# Patient Record
Sex: Female | Born: 1965 | Race: White | Hispanic: No | Marital: Married | Smoking: Never smoker
Health system: Southern US, Community
[De-identification: ages and names within clinical notes are randomized; demographics above are authoritative.]

## PROBLEM LIST (undated history)

## (undated) HISTORY — PX: TONSILLECTOMY AND ADENOIDECTOMY: SUR1326

## (undated) HISTORY — PX: REPAIR / REINSERT BICEPS TENDON AT ELBOW: SUR1148

---

## 1999-04-23 HISTORY — PX: KNEE ARTHROSCOPY W/ LATERAL RELEASE: SHX1873

## 1999-05-14 ENCOUNTER — Other Ambulatory Visit: Admission: RE | Admit: 1999-05-14 | Discharge: 1999-05-14 | Payer: Self-pay | Admitting: Gynecology

## 1999-06-26 ENCOUNTER — Other Ambulatory Visit: Admission: RE | Admit: 1999-06-26 | Discharge: 1999-06-26 | Payer: Self-pay | Admitting: Gynecology

## 1999-06-26 ENCOUNTER — Encounter (INDEPENDENT_AMBULATORY_CARE_PROVIDER_SITE_OTHER): Payer: Self-pay

## 1999-06-29 ENCOUNTER — Inpatient Hospital Stay (HOSPITAL_COMMUNITY): Admission: AD | Admit: 1999-06-29 | Discharge: 1999-06-29 | Payer: Self-pay | Admitting: Gynecology

## 2000-01-09 ENCOUNTER — Other Ambulatory Visit: Admission: RE | Admit: 2000-01-09 | Discharge: 2000-01-09 | Payer: Self-pay | Admitting: *Deleted

## 2000-07-24 ENCOUNTER — Inpatient Hospital Stay (HOSPITAL_COMMUNITY): Admission: AD | Admit: 2000-07-24 | Discharge: 2000-07-28 | Payer: Self-pay | Admitting: *Deleted

## 2000-07-24 ENCOUNTER — Encounter (INDEPENDENT_AMBULATORY_CARE_PROVIDER_SITE_OTHER): Payer: Self-pay

## 2000-07-29 ENCOUNTER — Encounter: Admission: RE | Admit: 2000-07-29 | Discharge: 2000-08-28 | Payer: Self-pay | Admitting: *Deleted

## 2000-09-08 ENCOUNTER — Other Ambulatory Visit: Admission: RE | Admit: 2000-09-08 | Discharge: 2000-09-08 | Payer: Self-pay | Admitting: *Deleted

## 2000-09-28 ENCOUNTER — Encounter: Admission: RE | Admit: 2000-09-28 | Discharge: 2000-10-28 | Payer: Self-pay | Admitting: *Deleted

## 2001-10-29 ENCOUNTER — Other Ambulatory Visit: Admission: RE | Admit: 2001-10-29 | Discharge: 2001-10-29 | Payer: Self-pay | Admitting: *Deleted

## 2002-05-26 ENCOUNTER — Inpatient Hospital Stay (HOSPITAL_COMMUNITY): Admission: AD | Admit: 2002-05-26 | Discharge: 2002-05-29 | Payer: Self-pay | Admitting: *Deleted

## 2002-07-06 ENCOUNTER — Other Ambulatory Visit: Admission: RE | Admit: 2002-07-06 | Discharge: 2002-07-06 | Payer: Self-pay | Admitting: *Deleted

## 2003-07-22 ENCOUNTER — Other Ambulatory Visit: Admission: RE | Admit: 2003-07-22 | Discharge: 2003-07-22 | Payer: Self-pay | Admitting: Gynecology

## 2003-12-07 ENCOUNTER — Encounter: Admission: RE | Admit: 2003-12-07 | Discharge: 2003-12-07 | Payer: Self-pay | Admitting: Gynecology

## 2004-09-13 ENCOUNTER — Other Ambulatory Visit: Admission: RE | Admit: 2004-09-13 | Discharge: 2004-09-13 | Payer: Self-pay | Admitting: Gynecology

## 2004-12-12 ENCOUNTER — Ambulatory Visit (HOSPITAL_COMMUNITY): Admission: RE | Admit: 2004-12-12 | Discharge: 2004-12-12 | Payer: Self-pay | Admitting: Gynecology

## 2005-11-12 ENCOUNTER — Encounter: Admission: RE | Admit: 2005-11-12 | Discharge: 2005-11-12 | Payer: Self-pay | Admitting: Gynecology

## 2006-10-08 ENCOUNTER — Other Ambulatory Visit: Admission: RE | Admit: 2006-10-08 | Discharge: 2006-10-08 | Payer: Self-pay | Admitting: Gynecology

## 2007-01-05 ENCOUNTER — Encounter: Admission: RE | Admit: 2007-01-05 | Discharge: 2007-01-05 | Payer: Self-pay | Admitting: Gynecology

## 2007-11-04 ENCOUNTER — Other Ambulatory Visit: Admission: RE | Admit: 2007-11-04 | Discharge: 2007-11-04 | Payer: Self-pay | Admitting: Gynecology

## 2008-03-24 ENCOUNTER — Ambulatory Visit (HOSPITAL_BASED_OUTPATIENT_CLINIC_OR_DEPARTMENT_OTHER): Admission: RE | Admit: 2008-03-24 | Discharge: 2008-03-24 | Payer: Self-pay | Admitting: Gynecology

## 2008-03-24 ENCOUNTER — Ambulatory Visit: Payer: Self-pay | Admitting: Radiology

## 2009-03-24 ENCOUNTER — Ambulatory Visit: Payer: Self-pay | Admitting: Women's Health

## 2009-03-24 ENCOUNTER — Other Ambulatory Visit: Admission: RE | Admit: 2009-03-24 | Discharge: 2009-03-24 | Payer: Self-pay | Admitting: Gynecology

## 2009-04-12 ENCOUNTER — Ambulatory Visit: Payer: Self-pay | Admitting: Radiology

## 2009-04-12 ENCOUNTER — Ambulatory Visit (HOSPITAL_BASED_OUTPATIENT_CLINIC_OR_DEPARTMENT_OTHER): Admission: RE | Admit: 2009-04-12 | Discharge: 2009-04-12 | Payer: Self-pay | Admitting: Gynecology

## 2010-05-13 ENCOUNTER — Encounter: Payer: Self-pay | Admitting: Gynecology

## 2010-05-14 ENCOUNTER — Encounter: Payer: Self-pay | Admitting: Gynecology

## 2010-09-07 NOTE — Discharge Summary (Signed)
   NAME:  Robin Burns, Robin Burns                 ACCOUNT NO.:  [id number]   MEDICAL RECORD NO.:  [id number]                   PATIENT TYPE:  INP   LOCATION:  9129                                 FACILITY:  [facility]   PHYSICIAN:  Ivor Costa. Farrel Gobble, M.D.              DATE OF BIRTH:  [birth year]   DATE OF ADMISSION:  05/26/2002  DATE OF DISCHARGE:  05/29/2002                                 DISCHARGE SUMMARY   DISCHARGE DIAGNOSES:  1. Intrauterine pregnancy 39 weeks delivered.  2. History of prior cesarean section, desired repeat cesarean section.  3. Supraventricular tachycardia during this pregnancy.  4. Status post repeat low transverse cesarean section by Katy Fitch,     M.D. on May 26, 2002.  Uterine dehiscence noted at time of cesarean     delivery.  5. Macrosomia.   HISTORY:  [name], para 1 with an EDC of  May 30, 2002.  Prenatal course was complicated by advanced maternal age.  Declined amniocentesis.  History of prior low transverse cesarean section.  Initially desired VBAC, but during pregnancy decided to go for repeat  cesarean section.  Had had a history of severe preeclampsia and HELLP  syndrome with prior pregnancy.  Also developed some chest palpitations,  diagnosed with SVT during the pregnancy.   HOSPITAL COURSE:  On May 26, 2002 patient was admitted at 39 weeks and  underwent a repeat low transverse cesarean section by Katy Fitch, M.D.  Underwent delivery of a female, Apgars of 8 and 8, weight of 9 pounds and 13  ounces.  There was noted, however, that there was a uterine scar was noted  to be dehisced on entrance into the pelvic cavity with otherwise normal  appearing tubes and ovaries.  Postoperatively patient remained afebrile,  voiding, stable condition and she was discharged to home on May 29, 2002  in satisfactory condition and given [facility] Gynecology postpartum  instruction/postpartum booklet.   ACCESSORY  CLINICAL FINDINGS:  Laboratories:  The patient is A+.  Rubella  immune.  On May 27, 2002 hemoglobin was 12.4.   DISPOSITION:  The patient is discharged to home.  Informed to return to  office six weeks.  If have any problem prior to that time to be seen in the  office.  Given prescription for Percocet p.r.n. pain.     Susa Loffler, P.A.                    Ivor Costa. Farrel Gobble, M.D.    TSG/MEDQ  D:  07/09/2002  T:  07/09/2002  Job:  [id number]

## 2010-09-07 NOTE — Discharge Summary (Signed)
[facility]  Patient:    Robin Burns, Robin Burns                MRN: [id number] Adm. Date:  [id number] Disc. Date: [id number] Attending:  Wetzel Bjornstad Dictator:   Antony Contras, N.P.                           Discharge Summary  DISCHARGE DIAGNOSES:          Homolysis, elevated liver enzymes, low platelet count syndrome (HELLP), at 36 6/7 weeks, oliguria, positive GBS, positive group B Streptococcus.  PROCEDURE:                    Primary low cervical transverse cesarean section with delivery of viable infant.  HISTORY OF PRESENT ILLNESS:   Patient is a 45 year old gravida 2, para 0-0-1-0 with an LMP of November 09, 1999, [facility] August 16, 2000.  Prenatal risk factors include a history of positive GBS per urine culture.  PRENATAL LABORATORIES:        Blood type A+.  Antibody screen negative.  RPR, HBSAG, HIV nonreactive.  Rubella immune.  HOSPITAL COURSE:              Patient presented to the office on July 23, 2000 with the complaint of some mild nausea and upper epigastric pain.  She denied any headaches, nausea, vomiting, visual changes.  Blood pressure was 140/90 without proteinuria.  PIH laboratories revealed her ALT to be elevated at 70, uric acid at 6, and platelets of 104,000.  She was admitted for management of severe preeclampsia and was begun on magnesium 4 g bolus, 2 g IV hourly, and also given penicillin prophylaxis for group B strep.  She was initially started on Cytotec 25 mcg vaginally and progressed to Pitocin once ruptured. Cervix at the initiation of induction was closed, thick, and high.  During the course of labor patient did develop oliguria and had a urine output of 30 cc/hour x 3 hours.  At that time it was discussed with patient and husband per Dr. Douglass Rivers to proceed to cesarean section delivery secondary to worsening disease.  Procedure was performed by Dr. Farrel Gobble assisted by Dr. Cyndia Bent under epidural anesthesia.   Patient was delivered of an Apgar 8/9 female infant weighing 6 pounds 8 ounces.  Findings also revealed normal uterus, tubes, and ovaries.  Postpartum course:  Patient was admitted to [facility] and was continued on magnesium sulfate drip.  Her laboratories began to improve after delivery.  By her second postpartum day she was able to be transferred to the postpartum unit and continue with normal postpartum care.  She was able to be discharged in satisfactory condition on her fourth postoperative day.  LABORATORIES:                 CBC:  Hematocrit 35, hemoglobin 12.6, WBC 12.7, platelets 120,000.  DISPOSITION:                  She was to follow-up in the office, continue with her prenatal vitamins and iron and Percocet for pain. DD:  09/01/00 TD:  09/01/00 Job: [id number]

## 2010-09-07 NOTE — H&P (Signed)
[facility]  Patient:    Robin Burns, Robin Burns                MRN: [id number] Adm. Date:  07/24/00 Attending:  Katy Fitch, M.D.                         History and Physical  CHIEF COMPLAINT:              A 36-week intrauterine pregnancy with severe preeclampsia.  HISTORY OF PRESENT ILLNESS:   The patient is a 45 year old, G2, P0 at 36-5/7 weeks, who presented to the office on July 23, 2000, complaining of some mild nausea with upper epigastric pain.  The patient states that she denied any headache, nausea, vomiting, or visual changes but her blood pressure was mildly elevated at 140/90 without an proteinuria.  The patient was then sent for [facility] labs which showed her ALT to be elevated at 70 and her uric acid at 6 and her platelet count to 104,000.  The patient does have good fetal movement denies any vaginal bleeding or rupture of membranes.  PAST MEDICAL HISTORY:         Significant for iron deficiency anemia.  PAST SURGICAL HISTORY:        Knee surgery in 2000.  PAST OBSTETRICAL HISTORY:     Ectopic pregnancy in March of 2001, treated with methotrexate.  PAST GYNECOLOGIC HISTORY:     Without any abnormal Paps or STDs.  MEDICATIONS:                  Prenatal vitamins.  ALLERGIES:                    None.  PERTINENT LABORATORY DATA:    A positive.  Rubella-immune.  Glucose 105.  GBS was in the urine.  PHYSICAL EXAMINATION:  VITAL SIGNS:                  Blood pressure 140/90.  HEENT:                        Throat clear.  NECK:                         Without any thyromegaly or JVD.  LUNGS:                        Clear to auscultation bilaterally.  HEART:                        Regular rate and rhythm.  ABDOMEN:                      Gravid, nontender.  Estimated fetal weight 6 pounds 10 ounces.  EXTREMITIES:                  Edema 1+.  DTRs 2+.  PELVIC:                       Cervix:  Closed, thick and high.  ASSESSMENT AND  PLAN:          A 45 year old, gravida 2, para 0 at 36 weeks with severe preeclampsia.  We will start magnesium with 4 g bolus, 2 g IV hourly.  She will also be given penicillin prophylaxis for group B streptococcus  in the urine with 5 million units IV bolus and 2.5 million units q.4h.  The patient will be started on Cytotec 25 mcg vaginally and once rupturable will be started on Pitocin. DD:  07/24/00 TD:  07/24/00 Job: [id number]

## 2010-09-07 NOTE — Op Note (Signed)
[facility]  Patient:    Robin Burns, Robin Burns                MRN: [id number] Proc. Date: 07/25/00 Adm. Date:  [id number] Attending:  Wetzel Bjornstad                           Operative Report  PREOPERATIVE DIAGNOSIS:       1. Hemolysis, elevated liver enzymes, and low                                  platelet count syndrome (HELLP) at 36-6/7                                  weeks.                               2. Oliguria.                               3. Positive group B Streptococcus.  POSTOPERATIVE DIAGNOSIS:      1. Hemolysis, elevated liver enzymes, and low                                  platelet count syndrome (HELLP) at 36-6/7                                  weeks.                               2. Oliguria.                               3. Positive group B Streptococcus.  OPERATION:                    Primary low transverse cesarean section, low flap transverse.  SURGEON:                      Douglass Rivers, M.D.  ASSISTANT:                    Jamey Reas, M.D.  ANESTHESIA:                   Epidural.  ESTIMATED BLOOD LOSS:         500 cc.  FINDINGS:                     A viable female infant in the vertex presentation, clear amniotic fluid, Apgars 8/9, birth weight 6 pounds 8 ounces, pH 7.35, normal uterus, tubes, and ovaries.  PATHOLOGY:                    Placenta.  COMPLICATIONS:                None.  DESCRIPTION OF PROCEDURE:     The patient was taken to the operating room. Epidural anesthesia  was induced, and the patient was placed in the supine position with left lateral displacement, prepped and draped in usual sterile fashion.  After adequate anesthesia was inserted, a Pfannenstiel skin incision was made with the scalpel.  Preop platelets were noted to be 90 prior to the beginning of the procedure.  The incision was carried through the underlying layer of fascia with electrocautery.  The fascia was scored in  the midline and incision was extended laterally with the Mayo scissors.  The inferior aspect of the fascial incision was grasped with Kochers.  The underlying rectus muscles were dissected off using blunt and sharp dissection. In a similar fashion, superior aspect of incision was grasped with Kochers, underlying rectus muscles dissected off.  The rectus muscle separated in the midline. The peritoneum identified and entered by blunt dissection.  The peritoneal incision was then extended superiorly to inferiorly with good visualization of underlying bowel and bladder.  Orientation of the uterus was confirmed, the vesicouterine peritoneum was identified, and entered sharply with the Metzenbaum scissors.  Incision was extended laterally.  The bladder flap was created digitally.  The bladder blade was then inserted in the lower uterine segment, incised in transverse fashion with scalpel.  Clear amniotic fluid was noted upon entering the cavity.  The infant was delivered from the vertex presentation.  Cord was cut and clamped.  The infant was handed off to waiting pediatricians.  Then pH and cord bloods were obtained.  The uterus was massaged, and the placenta was allowed to separate naturally.  The uterus was cleared of all clots and debris.  The uterine incision was repaired with a running locked layer of 0 chromic.  Inspection of the incision assured [place] of hemostasis.  The pelvis was irrigated with copious amounts of warm saline. The adnexa were inspected and noted to be normal.  The gutters were clear of all clots and debris.  Inspection of the peritoneum and underneath the bladder flap assures hemostasis.  The fascia was then closed with 0 Vicryl starting at the apex in a running fashion.  The subcutaneous tissue was irrigated.  The skin was closed with staples.  The patient tolerated the procedure well. Sponge counts were correct x 2.  She was transferred to the PACU in  stable condition. DD:  07/25/00 TD:  07/26/00 Job: [id number]

## 2010-09-07 NOTE — Discharge Summary (Signed)
[facility]  Patient:    Robin Burns, Robin Burns                MRN: [id number] Adm. Date:  [id number] Disc. Date: [id number] Attending:  Wetzel Bjornstad Dictator:   Antony Contras, N.P.                           Discharge Summary  DISCHARGE DIAGNOSES:          Intrauterine pregnancy at 36 weeks, HELLP syndrome, oliguria, failure to progress.  PROCEDURE:                    First degree low cervical transverse cesarean section with delivery of viable infant and prior to that induction of labor.  HISTORY OF PRESENT ILLNESS:   The patient is a 45 year old gravida 2, para 0-0-1-0 with an LMP of November 09, 1999, [facility] of August 16, 2000.  Prenatal risk factors include positive GBS status as noted by presence of GBS in the urine, severe preeclampsia developing at 36 weeks.  PRENATAL LABORATORIES:        Blood type A+.  Antibody screen is negative. RPR, HBSAG, HIV nonreactive.  Rubella immune.  HOSPITAL COURSE:              Patient had initially presented to the office on July 23, 2000 complaining of mild nausea with upper epigastric pain.  At this time her blood pressure was elevated 140/90 without any proteinuria.  PIH labs at that time revealed ALT to be elevated at 70, uric acid at 6, platelets 104,000.  At this time she was admitted to begin magnesium sulfate 4 g bolus, 2 g/hour.  She was also given penicillin prophylaxis for group B strep and was started on Cytotec to initiate induction of labor.  Artificial rupture of membranes was accomplished on July 25, 2000 which yielded clear fluid.  IUPC was placed.  Pitocin was initiated.  Patient remained on magnesium sulfate and Pitocin.  She slowly progressed from cervical status of closed, thick, and high to 1 cm, 90%, +1 station.  She also did develop oliguria and it was felt prudent due to the slow progress of her labor and worsening disease to proceed to cesarean section.  Low cervical transverse cesarean  section was performed by Dr. Douglass Rivers, assisted by Dr. Cyndia Bent under epidural anesthesia. Patient was delivered of an Apgar 8/9 female infant weighing 6 pounds 8 ounces. Pelvic findings included normal uterus, tubes, and ovaries.  Postpartum patient did remain on magnesium sulfate until her second postoperative day. At that time she was transferred from AICU to the postpartum unit and she was able to be discharged in satisfactory condition on her third postoperative day.  LABORATORIES:                 CBC:  Hematocrit 35, hemoglobin 12.6, WBC 12.7, platelets 120,000.  DISPOSITION:                  She is to be followed up in the office in six weeks.  Continue with prenatal vitamins.  Percocet for pain.DD:  08/11/00 TD:  08/11/00 Job: [id number]

## 2010-09-07 NOTE — Op Note (Signed)
NAME:  Robin Burns, Robin Burns                 ACCOUNT NO.:  [id number]   MEDICAL RECORD NO.:  [id number]                   PATIENT TYPE:  INP   LOCATION:  9198                                 FACILITY:  [facility]   PHYSICIAN:  Katy Fitch, M.D.               DATE OF BIRTH:  1965/11/01   DATE OF PROCEDURE:  05/26/2002  DATE OF DISCHARGE:                                 OPERATIVE REPORT   PREOPERATIVE DIAGNOSES:  1. A 39 week intrauterine pregnancy.  2. Prior low transverse cesarean section.   POSTOPERATIVE DIAGNOSES:  1. A 39 week intrauterine pregnancy.  2. Prior low transverse cesarean section.  3. Uterine dehiscence.   PROCEDURE:  Repeat low transverse cesarean section.   SURGEON:  Katy Fitch, M.D.   ASSIST:  Gaetano Hawthorne. Lily Peer, M.D.   ANESTHESIA:  Spinal.   ESTIMATED BLOOD LOSS:  850.   URINE OUTPUT:  225 and clear.   FLUIDS:  2800 crystalloid.   COMPLICATIONS:  None.   SPECIMENS:  Cord blood.   FINDINGS:  Living 9 pound 73 ounce female with Apgars 8 and 9 with three  vessel cord.  Placenta intact.  Uterine scar noted to be dehisced on  entrance into the pelvic cavity with normal appearing tubes and ovaries.   PROCEDURE:  The patient was taken to the operating room where spinal  anesthesia was administered.  The patient was prepped and draped in normal  sterile fashion.  A Pfannenstiel incision was made through the previous  incision site and carried down to the underlying fascia sharply.  The fascia  was excised and extended transversely using curved Mayo scissors.  The  underlying rectus muscles were dissected off both bluntly and sharply using  curved Mayo scissors from the fascia.  The rectus muscles were separated in  the midline manually and extended to the level of the pubic bone and  superiorly.  Entrance into the peritoneal cavity was done manually.  The  bladder was then taken down with Metzenbaum scissors.  At this point the  bladder flap was  identified and the infant's head was visible.  The bladder  flap was attempted to be taken down but once incised with Metzenbaum  scissors entrance into the uterine cavity was noted.  The incision was then  extended digitally.  The infant's head was then attempted to be delivered  with one time pushing.  Vacuum was then placed on the infant's occiput and  with one pull the infant's head was taken out of the incision without any  complications.  The mouth and nose were suctioned.  The rest of the baby was  then delivered without any difficulties.  The cord was clamped and cut and  the infant handed off to the awaiting NICU attendants.  Cord blood was  obtained and then the placenta was massaged from the uterus.  The uterus was  then exteriorized and the incision was grasped with ring forceps.  The  bladder was taken off the lower uterine segment.  A running 1-0 chromic  interlocking stitch was then used to close the lower uterine segment.  A  second layer was used to close the imbricating fascia.  The incision  appeared to be hemostatic at this point.  Pelvic irrigation was then done  with warm normal saline.  The uterus was returned to the abdominal cavity  and the subfascial area was inspected and noted to be hemostatic.  The  fascia was then started to be reapproximated with 0 Vicryl.  At this point  some bleeding was noted.  The closure of the fascia was discontinued.  The  suture was removed and reinspection of the pelvis was then commenced in a  methodical manner whereby two areas of the incision were closed with a  figure-of-eight.  The peritoneum which was scarred to the uterus was also  cauterized using a Bovie due to having some weeping and Surgicel was then  placed along the uterine incision and along the peritoneal edges.  The  pelvis then appeared to be hemostatic once again.  Reapproximation of the  fascia was once again attempted with 0 Vicryl in a running continuous  stitch.   At this point no blood was noted to be welling up.  The fascia was  completely closed at each angle and meeting in the midline.  The  subcutaneous tissue was then irrigated and Bovie cauterized in several areas  and the skin reapproximated using staples.  The patient was taken to  recovery room in stable condition.                                               Katy Fitch, M.D.    DC/MEDQ  D:  05/26/2002  T:  05/26/2002  Job:  [id number]

## 2010-09-07 NOTE — H&P (Signed)
   NAME:  Robin Burns, Robin Burns                 ACCOUNT NO.:  [id number]   MEDICAL RECORD NO.:  [id number]                   PATIENT TYPE:  INP   LOCATION:  NA                                   FACILITY:  [facility]   PHYSICIAN:  Katy Fitch, M.D.               DATE OF BIRTH:  Oct 14, 1965   DATE OF ADMISSION:  05/25/2002  DATE OF DISCHARGE:                                HISTORY & PHYSICAL   CHIEF COMPLAINT:  Elective repeat cesarean section.   HISTORY OF PRESENT ILLNESS:  The patient is a 45 year old G2, P1, at [redacted]  weeks gestation who will be admitted for elective repeat cesarean section.  The patient was initially wanting to attempt VBAC; however, she has changed  her mind and is opting to do elective repeat C section.  The patient was  advanced maternal age and refused amniocentesis during her pregnancy.  She  did develop some chest palpitations and was diagnosed with SVT during the  pregnancy but is not currently on any medications.   PAST MEDICAL HISTORY:  None.   PAST SURGICAL HISTORY:  1. Prior cesarean section for preeclampsia in 2002.  2. Knee surgery in 2000.  3. D&E.   MEDICATIONS:  Prenatal vitamins.   ALLERGIES:  No known drug allergies.   SOCIAL HISTORY:  Denies tobacco, alcohol, or drugs.   FAMILY HISTORY:  Denies mental retardation or epithelial cancers.   PHYSICAL EXAMINATION:  VITAL SIGNS:  Blood pressure 120/70.  HEENT:  Throat clear.  LUNGS:  Clear to auscultation bilaterally.  HEART:  Regular rate and rhythm.  ABDOMEN:  Gravid, nontender.  Estimated fetal weight 9 pounds.  PELVIC:  Cervix closed, thick, and high.   ASSESSMENT/PLAN:  A 45 year old gravida 2, para 1, admitted at 39 weeks for  elective repeat cesarean section.  The patient was given risks of surgery  including bleeding, transfusion, hepatitis or Human immunodeficiency virus,  infection, scar tissue, bleeding, breakdown, damage to internal organs,  fistula formation, need for hysterectomy  and future infertility, anesthetic  complications, pulmonary embolus, heart attack, stroke, or death.  The  patient states she understands these risks and desires to proceed.                                               Katy Fitch, M.D.   DC/MEDQ  D:  05/25/2002  T:  05/25/2002  Job:  [id number]

## 2010-09-11 ENCOUNTER — Other Ambulatory Visit: Payer: Self-pay | Admitting: Gynecology

## 2010-09-11 DIAGNOSIS — Z1231 Encounter for screening mammogram for malignant neoplasm of breast: Secondary | ICD-10-CM

## 2010-09-13 ENCOUNTER — Ambulatory Visit
Admission: RE | Admit: 2010-09-13 | Discharge: 2010-09-13 | Disposition: A | Discharge status: Home / Self Care | Payer: BC Managed Care – PPO | Source: Ambulatory Visit | Attending: Gynecology | Admitting: Gynecology

## 2010-09-13 DIAGNOSIS — Z1231 Encounter for screening mammogram for malignant neoplasm of breast: Secondary | ICD-10-CM

## 2010-12-05 ENCOUNTER — Encounter: Payer: Self-pay | Admitting: *Deleted

## 2010-12-12 ENCOUNTER — Encounter: Payer: Self-pay | Admitting: Women's Health

## 2010-12-12 ENCOUNTER — Other Ambulatory Visit (HOSPITAL_COMMUNITY)
Admission: RE | Admit: 2010-12-12 | Discharge: 2010-12-12 | Disposition: A | Discharge status: Home / Self Care | Payer: BC Managed Care – PPO | Source: Ambulatory Visit | Attending: Women's Health | Admitting: Women's Health

## 2010-12-12 ENCOUNTER — Ambulatory Visit (INDEPENDENT_AMBULATORY_CARE_PROVIDER_SITE_OTHER): Payer: BC Managed Care – PPO | Admitting: Women's Health

## 2010-12-12 VITALS — BP 120/70 | Ht 63.0 in | Wt 140.0 lb

## 2010-12-12 DIAGNOSIS — N920 Excessive and frequent menstruation with regular cycle: Secondary | ICD-10-CM

## 2010-12-12 DIAGNOSIS — Z01419 Encounter for gynecological examination (general) (routine) without abnormal findings: Secondary | ICD-10-CM

## 2010-12-12 MED ORDER — IBUPROFEN 600 MG PO TABS: 600.0000 mg | ORAL_TABLET | Freq: Three times a day (TID) | ORAL | Status: AC | PRN

## 2010-12-12 NOTE — Progress Notes (Signed)
Robin Burns [facility] 1965-06-05 [id number]    History:    The patient presents for annual exam.  Stay-at-home mom, very athletic, having some problems with a tennis elbow. She does have followup with her orthopedist.   Past medical history, past surgical history, family history and social history were all reviewed and documented in the EPIC chart.   ROS:  A  ROS was performed and pertinent positives and negatives are included in the history.  Exam:  Filed Vitals:   12/12/10 0840  BP: 120/70    General appearance:  Normal Head/Neck:  Normal, without cervical or supraclavicular adenopathy. Thyroid:  Symmetrical, normal in size, without palpable masses or nodularity. Respiratory  Effort:  Normal  Auscultation:  Clear without wheezing or rhonchi Cardiovascular  Auscultation:  Regular rate, without rubs, murmurs or gallops  Edema/varicosities:  Not grossly evident Abdominal   Soft,nontender, without masses, guarding or rebound.  Liver/spleen:  No organomegaly noted  Hernia:  None appreciated  Occult test:   Skin  Inspection:  Grossly normal  Palpation:  Grossly normal Neurologic/psychiatric  Orientation:  Normal with appropriate conversation.  Mood/affect:  Normal  Genitourinary    Breasts: Examined lying and sitting.     Right: Without masses, retractions, discharge or axillary adenopathy.     Left: Without masses, retractions, discharge or axillary adenopathy.   Inguinal/mons:  Normal without inguinal adenopathy  External genitalia:  Normal  BUS/Urethra/Skene's glands:  Normal  Bladder:  Normal  Vagina:  Normal  Cervix:  Normal  Uterus:  retroverted, normal in size, shape and contour.  Midline and mobile  Adnexa/parametria:     Rt: Without masses or tenderness.   Lt: Without masses or tenderness.  Anus and perineum: Normal  Digital rectal exam: Normal sphincter tone without palpated masses or tenderness  Assessment/Plan:  45 y.o.  MWF G3P2 for annual exam.    Having a regular monthly cycle for 5-6 days 2 or 3 of those days are heavy. Husband had a vasectomy. Will try Motrin 600 every 8 hours with her cycles to see if that may lighten her flow. Reviewed lysed the the, Mirena IUD, pills, will try the Motrin first. SBEs, normal mammogram in May, will continue an annual screening. Calcium rich diet encouraged, she does have an yearly scheduled with a primary care will have her labs done at her Pap only today. History of all normal Paps.Marland Kitchen    Harrington Challenger WHNP, 9:05 AM 12/12/2010

## 2010-12-27 ENCOUNTER — Ambulatory Visit (HOSPITAL_BASED_OUTPATIENT_CLINIC_OR_DEPARTMENT_OTHER)
Admission: RE | Admit: 2010-12-27 | Discharge: 2010-12-27 | Disposition: A | Discharge status: Home / Self Care | Payer: BC Managed Care – PPO | Source: Ambulatory Visit | Attending: Orthopedic Surgery | Admitting: Orthopedic Surgery

## 2010-12-27 DIAGNOSIS — M7710 Lateral epicondylitis, unspecified elbow: Secondary | ICD-10-CM | POA: Insufficient documentation

## 2010-12-27 DIAGNOSIS — M249 Joint derangement, unspecified: Secondary | ICD-10-CM | POA: Insufficient documentation

## 2010-12-27 LAB — POCT HEMOGLOBIN-HEMACUE: Hemoglobin: 13.5 g/dL (ref 12.0–15.0)

## 2011-01-01 NOTE — Op Note (Addendum)
  NAMEJHOANNA, Robin Burns       ACCOUNT NO.:  [id number]  MEDICAL RECORD NO.:  [id number]  LOCATION:                                 FACILITY:  PHYSICIAN:  Loreta Ave, M.D. DATE OF BIRTH:  21-Jun-1965  DATE OF PROCEDURE:  12/27/2010 DATE OF DISCHARGE:                              OPERATIVE REPORT   PREOPERATIVE DIAGNOSIS:  Overuse, lateral epicondylitis, right elbow with tearing extensor carpi radialis brevis tendon.  POSTOPERATIVE DIAGNOSIS:  Overuse, lateral epicondylitis, right elbow with tearing extensor carpi radialis brevis tendon.  PROCEDURE:  Right elbow exploration with debridement of ECRB tendon. Epicondylar debriding and drilling.  Repair of superficial extensors over defect.  SURGEON:  Loreta Ave, MD  ASSISTANT:  Genene Churn. Barry Dienes, [place]  ANESTHESIA:  General.  BLOOD LOSS:  Minimal.  SPECIMENS:  None.  CULTURES:  None.  COMPLICATIONS:  None.  DRESSING:  Sterile compressive with splint.DESCRIPTION OF PROCEDURE:  The patient was brought to the operating room and placed on the operating table in supine position.  After adequate anesthesia had been obtained, tourniquet applied, prepped and draped in usual sterile fashion.  Exsanguinated elevation of Esmarch, tourniquet inflated to 250 mmHg.  At completion of the case, final tourniquet time was 45 minutes.  Longitudinal incision from the epicondyle distally. Skin and subcutaneous tissue divided.  Superficial extensors intact, divided longitudinally.  Complete tearing ECRB tendon as well as capsule with marked mucinous degeneration extending down to the level of the radial head.  All torn abnormal inflated tissue debrided back to healthy tissue.  Superficial extensors still intact.  Moderate effusion including the elbow.  No evidence of instability and no significant chondral change in the elbow.  Irrigated.  Epicondyle debrided, treated with multiple drilling.  Irrigated once again.  Superficial  extensors were then repaired with a running Vicryl back over top of the defect, maintaining longitudinal continuity.  Wound irrigated and closed with subcutaneous subcuticular Vicryl.  Sterile compressive dressing applied.  Tourniquet deflated and removed.  Splint applied.  Anesthesia reversed.  Brought to recovery room.  Tolerated the surgery well, no complications.     Loreta Ave, M.D.     DFM/MEDQ  D:  12/27/2010  T:  12/27/2010  Job:  [id number]  Electronically Signed by Mckinley Jewel M.D. on 01/23/2011 02:31:30 PM

## 2011-09-02 ENCOUNTER — Other Ambulatory Visit: Payer: Self-pay | Admitting: Gynecology

## 2011-09-02 DIAGNOSIS — Z1231 Encounter for screening mammogram for malignant neoplasm of breast: Secondary | ICD-10-CM

## 2011-09-17 ENCOUNTER — Ambulatory Visit
Admission: RE | Admit: 2011-09-17 | Discharge: 2011-09-17 | Disposition: A | Discharge status: Home / Self Care | Payer: Commercial Indemnity | Source: Ambulatory Visit | Attending: Gynecology | Admitting: Gynecology

## 2011-09-17 DIAGNOSIS — Z1231 Encounter for screening mammogram for malignant neoplasm of breast: Secondary | ICD-10-CM

## 2012-03-26 ENCOUNTER — Ambulatory Visit (INDEPENDENT_AMBULATORY_CARE_PROVIDER_SITE_OTHER): Payer: PRIVATE HEALTH INSURANCE | Admitting: Women's Health

## 2012-03-26 ENCOUNTER — Encounter: Payer: Self-pay | Admitting: Women's Health

## 2012-03-26 VITALS — BP 102/72 | Ht 63.5 in | Wt 142.0 lb

## 2012-03-26 DIAGNOSIS — Z01419 Encounter for gynecological examination (general) (routine) without abnormal findings: Secondary | ICD-10-CM

## 2012-03-26 DIAGNOSIS — G44209 Tension-type headache, unspecified, not intractable: Secondary | ICD-10-CM

## 2012-03-26 DIAGNOSIS — F4541 Pain disorder exclusively related to psychological factors: Secondary | ICD-10-CM

## 2012-03-26 NOTE — Progress Notes (Signed)
Robin Burns [facility] Jan 06, 1966 [id number]    History:    The patient presents for annual exam.  Regular monthly cycle/vasectomy. Cycles heavy for 2 days good relief with Motrin. Labs at primary care. Normal mammogram and Pap history. Right elbow surgery last year/tennis injury. Currently being seen by Dr. Clarisse Gouge for headaches.   Past medical history, past surgical history, family history and social history were all reviewed and documented in the EPIC chart. 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  HELLP syndrome with first pregnancy. Hockey player and coaches.   ROS:  A  ROS was performed and pertinent positives and negatives are included in the history.  Exam:  Filed Vitals:   03/26/12 1120  BP: 102/72    General appearance:  Normal Head/Neck:  Normal, without cervical or supraclavicular adenopathy. Thyroid:  Symmetrical, normal in size, without palpable masses or nodularity. Respiratory  Effort:  Normal  Auscultation:  Clear without wheezing or rhonchi Cardiovascular  Auscultation:  Regular rate, without rubs, murmurs or gallops  Edema/varicosities:  Not grossly evident Abdominal  Soft,nontender, without masses, guarding or rebound.  Liver/spleen:  No organomegaly noted  Hernia:  None appreciated  Skin  Inspection:  Grossly normal  Palpation:  Grossly normal Neurologic/psychiatric  Orientation:  Normal with appropriate conversation.  Mood/affect:  Normal  Genitourinary    Breasts: Examined lying and sitting.     Right: Without masses, retractions, discharge or axillary adenopathy.     Left: Without masses, retractions, discharge or axillary adenopathy.   Inguinal/mons:  Normal without inguinal adenopathy  External genitalia:  Normal  BUS/Urethra/Skene's glands:  Normal  Bladder:  Normal  Vagina:  Normal  Cervix:  Normal  Uterus:   normal in size, shape and contour.  Midline and mobile  Adnexa/parametria:     Rt: Without masses or tenderness.   Lt: Without masses  or tenderness.  Anus and perineum: Normal  Digital rectal exam: Normal sphincter tone without palpated masses or tenderness  Assessment/Plan:  46 y.o. M. WF G3 P2 for annual exam.     Headaches-Dr. Lewit Normal GYN exam/vasectomy  Plan: SBE's, continue annual mammogram, calcium rich diet, vitamin D 1000 daily encouraged. Labs at primary care, UA, Pap normal 2012, new screening guidelines reviewed. Continue Motrin as needed for menorrhagia.  Harrington Challenger [facility], 12:04 PM 03/26/2012

## 2012-03-26 NOTE — Patient Instructions (Addendum)

## 2012-03-27 LAB — URINALYSIS W MICROSCOPIC + REFLEX CULTURE
Crystals: NONE SEEN
Hgb urine dipstick: NEGATIVE
Ketones, ur: NEGATIVE mg/dL
Leukocytes, UA: NEGATIVE
Squamous Epithelial / LPF: NONE SEEN

## 2013-02-24 ENCOUNTER — Ambulatory Visit (HOSPITAL_BASED_OUTPATIENT_CLINIC_OR_DEPARTMENT_OTHER)
Admission: RE | Admit: 2013-02-24 | Discharge: 2013-02-24 | Disposition: A | Discharge status: Home / Self Care | Payer: PRIVATE HEALTH INSURANCE | Source: Ambulatory Visit | Attending: Women's Health | Admitting: Women's Health

## 2013-02-24 ENCOUNTER — Other Ambulatory Visit: Payer: Self-pay

## 2013-02-24 DIAGNOSIS — Z1231 Encounter for screening mammogram for malignant neoplasm of breast: Secondary | ICD-10-CM

## 2013-03-30 ENCOUNTER — Other Ambulatory Visit (HOSPITAL_COMMUNITY)
Admission: RE | Admit: 2013-03-30 | Discharge: 2013-03-30 | Disposition: A | Discharge status: Home / Self Care | Payer: PRIVATE HEALTH INSURANCE | Source: Ambulatory Visit | Attending: Gynecology | Admitting: Gynecology

## 2013-03-30 ENCOUNTER — Ambulatory Visit (INDEPENDENT_AMBULATORY_CARE_PROVIDER_SITE_OTHER): Payer: 59 | Admitting: Women's Health

## 2013-03-30 ENCOUNTER — Encounter: Payer: Self-pay | Admitting: Women's Health

## 2013-03-30 VITALS — BP 116/74 | Ht 63.5 in | Wt 152.6 lb

## 2013-03-30 DIAGNOSIS — Z13220 Encounter for screening for lipoid disorders: Secondary | ICD-10-CM

## 2013-03-30 DIAGNOSIS — Z833 Family history of diabetes mellitus: Secondary | ICD-10-CM

## 2013-03-30 DIAGNOSIS — E079 Disorder of thyroid, unspecified: Secondary | ICD-10-CM

## 2013-03-30 DIAGNOSIS — Z01419 Encounter for gynecological examination (general) (routine) without abnormal findings: Secondary | ICD-10-CM | POA: Insufficient documentation

## 2013-03-30 DIAGNOSIS — N946 Dysmenorrhea, unspecified: Secondary | ICD-10-CM

## 2013-03-30 LAB — CBC WITH DIFFERENTIAL/PLATELET
Basophils Relative: 1 % (ref 0–1)
Eosinophils Relative: 2 % (ref 0–5)
HCT: 39.5 % (ref 36.0–46.0)
Hemoglobin: 13.3 g/dL (ref 12.0–15.0)
Lymphocytes Relative: 28 % (ref 12–46)
Lymphs Abs: 1.4 10*3/uL (ref 0.7–4.0)
MCV: 84.6 fL (ref 78.0–100.0)
Monocytes Absolute: 0.3 10*3/uL (ref 0.1–1.0)
Neutro Abs: 3 10*3/uL (ref 1.7–7.7)
RDW: 14 % (ref 11.5–15.5)
WBC: 4.9 10*3/uL (ref 4.0–10.5)

## 2013-03-30 LAB — URINALYSIS W MICROSCOPIC + REFLEX CULTURE
Bacteria, UA: NONE SEEN
Casts: NONE SEEN
Crystals: NONE SEEN
Ketones, ur: NEGATIVE mg/dL
Leukocytes, UA: NEGATIVE
Nitrite: NEGATIVE
Specific Gravity, Urine: 1.006 (ref 1.005–1.030)
Squamous Epithelial / LPF: NONE SEEN

## 2013-03-30 LAB — LIPID PANEL: Total CHOL/HDL Ratio: 3.1 Ratio

## 2013-03-30 MED ORDER — ETONOGESTREL-ETHINYL ESTRADIOL 0.12-0.015 MG/24HR VA RING: VAGINAL_RING | VAGINAL | Status: DC

## 2013-03-30 MED ORDER — IBUPROFEN 800 MG PO TABS: 800.0000 mg | ORAL_TABLET | Freq: Three times a day (TID) | ORAL | Status: DC | PRN

## 2013-03-30 NOTE — Progress Notes (Signed)
Robin Burns [facility] 07-01-1965 [id number]    History:    The patient presents for annual exam.  Monthly cycle with increasing dysmenorrhea/menorrhagia/moodiness. No bleeding between cycles. Vasectomy. Normal Pap and mammogram history. Headaches better, has seen Dr. Vela Prose   Past medical history, past surgical history, family history and social history were all reviewed and documented in the EPIC chart. 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   ROS:  A  ROS was performed and pertinent positives and negatives are included in the history.  Exam:  Filed Vitals:   03/30/13 0834  BP: 116/74    General appearance:  Normal Head/Neck:  Normal, without cervical or supraclavicular adenopathy. Thyroid:  Symmetrical, normal in size, without palpable masses or nodularity. Respiratory  Effort:  Normal  Auscultation:  Clear without wheezing or rhonchi Cardiovascular  Auscultation:  Regular rate, without rubs, murmurs or gallops  Edema/varicosities:  Not grossly evident Abdominal  Soft,nontender, without masses, guarding or rebound.  Liver/spleen:  No organomegaly noted  Hernia:  None appreciated  Skin  Inspection:  Grossly normal  Palpation:  Grossly normal Neurologic/psychiatric  Orientation:  Normal with appropriate conversation.  Mood/affect:  Normal  Genitourinary    Breasts: Examined lying and sitting.     Right: Without masses, retractions, discharge or axillary adenopathy.     Left: Without masses, retractions, discharge or axillary adenopathy.   Inguinal/mons:  Normal without inguinal adenopathy  External genitalia:  Normal  BUS/Urethra/Skene's glands:  Normal  Bladder:  Normal  Vagina:  Normal  Cervix:  Normal  Uterus:   normal in size, shape and contour.  Midline and mobile  Adnexa/parametria:     Rt: Without masses or tenderness.   Lt: Without masses or tenderness.  Anus and perineum: Normal  Digital  rectal exam: Normal sphincter tone without palpated masses or tenderness  Assessment/Plan:  47 y.o.MWF G2P2  for annual exam.     Perimenopausal Vasectomy  Plan: Options reviewed, would like to try NuvaRing prescription, proper use, slight risk for blood clots, strokes reviewed. Will start with next cycle. Instructed to call if no relief of dysmenorrhea/menorrhagia/moodiness. SBE's, continue annual mammogram, a3D tomography reviewed and encouraged, history of dense breasts. Continue regular exercise, calcium rich diet, vitamin D 1000 daily encouraged. CBC, glucose, lipid panel, TSH, UA, Pap.  Normal 2012, new screening guidelines reviewed.   Harrington Challenger [facility], 9:17 AM 03/30/2013

## 2013-03-30 NOTE — Addendum Note (Signed)
Addended by: Richardson Chiquito on: 03/30/2013 10:31 AM   Modules accepted: Orders

## 2013-03-30 NOTE — Patient Instructions (Signed)

## 2013-11-16 ENCOUNTER — Other Ambulatory Visit: Payer: Self-pay | Admitting: Orthopedic Surgery

## 2013-11-16 DIAGNOSIS — M25512 Pain in left shoulder: Secondary | ICD-10-CM

## 2013-11-22 ENCOUNTER — Ambulatory Visit
Admission: RE | Admit: 2013-11-22 | Discharge: 2013-11-22 | Disposition: A | Discharge status: Home / Self Care | Payer: 59 | Source: Ambulatory Visit | Attending: Orthopedic Surgery | Admitting: Orthopedic Surgery

## 2013-11-22 DIAGNOSIS — M25512 Pain in left shoulder: Secondary | ICD-10-CM

## 2013-11-24 DIAGNOSIS — M25812 Other specified joint disorders, left shoulder: Secondary | ICD-10-CM | POA: Insufficient documentation

## 2014-02-21 ENCOUNTER — Encounter: Payer: Self-pay | Admitting: Women's Health

## 2014-04-04 ENCOUNTER — Other Ambulatory Visit: Payer: Self-pay | Admitting: Gynecology

## 2014-04-04 DIAGNOSIS — Z1231 Encounter for screening mammogram for malignant neoplasm of breast: Secondary | ICD-10-CM

## 2014-04-08 ENCOUNTER — Ambulatory Visit (HOSPITAL_BASED_OUTPATIENT_CLINIC_OR_DEPARTMENT_OTHER)
Admission: RE | Admit: 2014-04-08 | Discharge: 2014-04-08 | Disposition: A | Discharge status: Home / Self Care | Payer: PRIVATE HEALTH INSURANCE | Source: Ambulatory Visit | Attending: Gynecology | Admitting: Gynecology

## 2014-04-08 DIAGNOSIS — Z1231 Encounter for screening mammogram for malignant neoplasm of breast: Secondary | ICD-10-CM | POA: Insufficient documentation

## 2014-04-29 ENCOUNTER — Encounter: Payer: 59 | Admitting: Women's Health

## 2014-05-11 ENCOUNTER — Encounter: Payer: 59 | Admitting: Women's Health

## 2015-03-23 ENCOUNTER — Encounter: Payer: Self-pay | Admitting: Women's Health

## 2015-03-23 ENCOUNTER — Other Ambulatory Visit (HOSPITAL_COMMUNITY)
Admission: RE | Admit: 2015-03-23 | Discharge: 2015-03-23 | Disposition: A | Discharge status: Home / Self Care | Payer: PRIVATE HEALTH INSURANCE | Source: Ambulatory Visit | Attending: Women's Health | Admitting: Women's Health

## 2015-03-23 ENCOUNTER — Ambulatory Visit (INDEPENDENT_AMBULATORY_CARE_PROVIDER_SITE_OTHER): Payer: 59 | Admitting: Women's Health

## 2015-03-23 VITALS — BP 115/73 | Ht 63.0 in | Wt 152.2 lb

## 2015-03-23 DIAGNOSIS — Z1329 Encounter for screening for other suspected endocrine disorder: Secondary | ICD-10-CM

## 2015-03-23 DIAGNOSIS — Z01419 Encounter for gynecological examination (general) (routine) without abnormal findings: Secondary | ICD-10-CM | POA: Insufficient documentation

## 2015-03-23 DIAGNOSIS — Z1151 Encounter for screening for human papillomavirus (HPV): Secondary | ICD-10-CM | POA: Insufficient documentation

## 2015-03-23 DIAGNOSIS — Z13220 Encounter for screening for lipoid disorders: Secondary | ICD-10-CM | POA: Diagnosis not present

## 2015-03-23 DIAGNOSIS — N946 Dysmenorrhea, unspecified: Secondary | ICD-10-CM

## 2015-03-23 DIAGNOSIS — N920 Excessive and frequent menstruation with regular cycle: Secondary | ICD-10-CM | POA: Insufficient documentation

## 2015-03-23 LAB — CBC WITH DIFFERENTIAL/PLATELET
Basophils Absolute: 0.1 10*3/uL (ref 0.0–0.1)
Basophils Relative: 1 % (ref 0–1)
EOS ABS: 0.1 10*3/uL (ref 0.0–0.7)
EOS PCT: 1 % (ref 0–5)
HCT: 39.6 % (ref 36.0–46.0)
Hemoglobin: 13.1 g/dL (ref 12.0–15.0)
LYMPHS ABS: 1.3 10*3/uL (ref 0.7–4.0)
Lymphocytes Relative: 26 % (ref 12–46)
MCH: 28.5 pg (ref 26.0–34.0)
MCHC: 33.1 g/dL (ref 30.0–36.0)
MCV: 86.3 fL (ref 78.0–100.0)
MONO ABS: 0.3 10*3/uL (ref 0.1–1.0)
MONOS PCT: 6 % (ref 3–12)
MPV: 9.5 fL (ref 8.6–12.4)
Neutro Abs: 3.3 10*3/uL (ref 1.7–7.7)
Neutrophils Relative %: 66 % (ref 43–77)
PLATELETS: 260 10*3/uL (ref 150–400)
RBC: 4.59 MIL/uL (ref 3.87–5.11)
RDW: 13.3 % (ref 11.5–15.5)
WBC: 5 10*3/uL (ref 4.0–10.5)

## 2015-03-23 LAB — COMPREHENSIVE METABOLIC PANEL
ALT: 14 U/L (ref 6–29)
AST: 17 U/L (ref 10–35)
Albumin: 3.8 g/dL (ref 3.6–5.1)
Alkaline Phosphatase: 31 U/L — ABNORMAL LOW (ref 33–115)
BILIRUBIN TOTAL: 0.3 mg/dL (ref 0.2–1.2)
BUN: 10 mg/dL (ref 7–25)
CALCIUM: 9.1 mg/dL (ref 8.6–10.2)
CO2: 23 mmol/L (ref 20–31)
Chloride: 104 mmol/L (ref 98–110)
Creat: 0.79 mg/dL (ref 0.50–1.10)
GLUCOSE: 93 mg/dL (ref 65–99)
Potassium: 4.2 mmol/L (ref 3.5–5.3)
Sodium: 138 mmol/L (ref 135–146)
Total Protein: 6.4 g/dL (ref 6.1–8.1)

## 2015-03-23 LAB — LIPID PANEL
CHOL/HDL RATIO: 3.2 ratio (ref <=5.0)
Cholesterol: 186 mg/dL (ref 125–200)
HDL: 58 mg/dL (ref >=46)
LDL CALC: 115 mg/dL (ref <130)
Triglycerides: 65 mg/dL (ref <150)
VLDL: 13 mg/dL (ref <30)

## 2015-03-23 LAB — TSH: TSH: 2.267 u[IU]/mL (ref 0.350–4.500)

## 2015-03-23 MED ORDER — ETONOGESTREL-ETHINYL ESTRADIOL 0.12-0.015 MG/24HR VA RING
VAGINAL_RING | VAGINAL | Status: DC
Start: 2015-03-23 — End: 2016-03-06

## 2015-03-23 NOTE — Progress Notes (Signed)
Robin Burns [birth year] [id number]    History:    Presents for annual exam.  Cycles every 3 weeks heavy flow changing every 1-2 hours passing clots. No bleeding or spotting between cycles. Reports cycles have become heavier over the last 2 years . Vasectomy. Normal Pap and mammogram history. Taking an over-the-counter mood supplement which she reports is helping.  Past medical history, past surgical history, family history and social history were all reviewed and documented in the EPIC chart. 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  ROS:  A ROS was performed and pertinent positives and negatives are included.  Exam:  Filed Vitals:   03/23/15 0835  BP: 115/73    General appearance:  Normal Thyroid:  Symmetrical, normal in size, without palpable masses or nodularity. Respiratory  Auscultation:  Clear without wheezing or rhonchi Cardiovascular  Auscultation:  Regular rate, without rubs, murmurs or gallops  Edema/varicosities:  Not grossly evident Abdominal  Soft,nontender, without masses, guarding or rebound.  Liver/spleen:  No organomegaly noted  Hernia:  None appreciated  Skin  Inspection:  Grossly normal   Breasts: Examined lying and sitting.     Right: Without masses, retractions, discharge or axillary adenopathy.     Left: Without masses, retractions, discharge or axillary adenopathy. Gentitourinary   Inguinal/mons:  Normal without inguinal adenopathy  External genitalia:  Normal  BUS/Urethra/Skene's glands:  Normal  Vagina:  Normal  Cervix:  Normal  Uterus:  normal in size, shape and contour.  Midline and mobile  Adnexa/parametria:     Rt: Without masses or tenderness.   Lt: Without masses or tenderness.  Anus and perineum: Normal  Digital rectal exam: Normal sphincter tone without palpated masses or tenderness  Assessment/Plan:  49 y.o. MWF G2 P2  for annual exam with complaint of heavy cycles every 3  weeks, requesting help with cycle control and flow.  Monthly cycle with Menorrhagia/vasectomy  Plan: Options reviewed, will try NuvaRing prescription, proper use given and reviewed slight risk for blood clots and strokes. Instructed to call if no relief of menorrhagia after 2 or 3 cycles. SBE's, continue annual screening mammogram, 3-D tomography reviewed and encouraged history of dense breasts. Continue active lifestyle, exercise, tennis, calcium rich diet. CBC, TSH, lipid panel, CMP, UA, Pap with HR HPV typing, new screening guidelines reviewed.    Harrington ChallengerYOUNG,Raahil Ong [facility] LLCWHNP, 10:27 AM 03/23/2015

## 2015-03-23 NOTE — Patient Instructions (Signed)
Health Maintenance, Female Adopting a healthy lifestyle and getting preventive care can go a long way to promote health and wellness. Talk with your health care provider about what schedule of regular examinations is right for you. This is a good chance for you to check in with your provider about disease prevention and staying healthy. In between checkups, there are plenty of things you can do on your own. Experts have done a lot of research about which lifestyle changes and preventive measures are most likely to keep you healthy. Ask your health care provider for more information. WEIGHT AND DIET  Eat a healthy diet  Be sure to include plenty of vegetables, fruits, low-fat dairy products, and lean protein.  Do not eat a lot of foods high in solid fats, added sugars, or salt.  Get regular exercise. This is one of the most important things you can do for your health.  Most adults should exercise for at least 150 minutes each week. The exercise should increase your heart rate and make you sweat (moderate-intensity exercise).  Most adults should also do strengthening exercises at least twice a week. This is in addition to the moderate-intensity exercise.  Maintain a healthy weight  Body mass index (BMI) is a measurement that can be used to identify possible weight problems. It estimates body fat based on height and weight. Your health care provider can help determine your BMI and help you achieve or maintain a healthy weight.  For females 20 years of age and older:   A BMI below 18.5 is considered underweight.  A BMI of 18.5 to 24.9 is normal.  A BMI of 25 to 29.9 is considered overweight.  A BMI of 30 and above is considered obese.  Watch levels of cholesterol and blood lipids  You should start having your blood tested for lipids and cholesterol at 48 years of age, then have this test every 5 years.  You may need to have your cholesterol levels checked more often if:  Your lipid  or cholesterol levels are high.  You are older than 48 years of age.  You are at high risk for heart disease.  CANCER SCREENING   Lung Cancer  Lung cancer screening is recommended for adults 55-80 years old who are at high risk for lung cancer because of a history of smoking.  A yearly low-dose CT scan of the lungs is recommended for people who:  Currently smoke.  Have quit within the past 15 years.  Have at least a 30-pack-year history of smoking. A pack year is smoking an average of one pack of cigarettes a day for 1 year.  Yearly screening should continue until it has been 15 years since you quit.  Yearly screening should stop if you develop a health problem that would prevent you from having lung cancer treatment.  Breast Cancer  Practice breast self-awareness. This means understanding how your breasts normally appear and feel.  It also means doing regular breast self-exams. Let your health care provider know about any changes, no matter how small.  If you are in your 20s or 30s, you should have a clinical breast exam (CBE) by a health care provider every 1-3 years as part of a regular health exam.  If you are 40 or older, have a CBE every year. Also consider having a breast X-ray (mammogram) every year.  If you have a family history of breast cancer, talk to your health care provider about genetic screening.  If you   are at high risk for breast cancer, talk to your health care provider about having an MRI and a mammogram every year.  Breast cancer gene (BRCA) assessment is recommended for women who have family members with BRCA-related cancers. BRCA-related cancers include:  Breast.  Ovarian.  Tubal.  Peritoneal cancers.  Results of the assessment will determine the need for genetic counseling and BRCA1 and BRCA2 testing. Cervical Cancer Your health care provider may recommend that you be screened regularly for cancer of the pelvic organs (ovaries, uterus, and  vagina). This screening involves a pelvic examination, including checking for microscopic changes to the surface of your cervix (Pap test). You may be encouraged to have this screening done every 3 years, beginning at age 21.  For women ages 30-65, health care providers may recommend pelvic exams and Pap testing every 3 years, or they may recommend the Pap and pelvic exam, combined with testing for human papilloma virus (HPV), every 5 years. Some types of HPV increase your risk of cervical cancer. Testing for HPV may also be done on women of any age with unclear Pap test results.  Other health care providers may not recommend any screening for nonpregnant women who are considered low risk for pelvic cancer and who do not have symptoms. Ask your health care provider if a screening pelvic exam is right for you.  If you have had past treatment for cervical cancer or a condition that could lead to cancer, you need Pap tests and screening for cancer for at least 20 years after your treatment. If Pap tests have been discontinued, your risk factors (such as having a new sexual partner) need to be reassessed to determine if screening should resume. Some women have medical problems that increase the chance of getting cervical cancer. In these cases, your health care provider may recommend more frequent screening and Pap tests. Colorectal Cancer  This type of cancer can be detected and often prevented.  Routine colorectal cancer screening usually begins at 48 years of age and continues through 48 years of age.  Your health care provider may recommend screening at an earlier age if you have risk factors for colon cancer.  Your health care provider may also recommend using home test kits to check for hidden blood in the stool.  A small camera at the end of a tube can be used to examine your colon directly (sigmoidoscopy or colonoscopy). This is done to check for the earliest forms of colorectal  cancer.  Routine screening usually begins at age 50.  Direct examination of the colon should be repeated every 5-10 years through 48 years of age. However, you may need to be screened more often if early forms of precancerous polyps or small growths are found. Skin Cancer  Check your skin from head to toe regularly.  Tell your health care provider about any new moles or changes in moles, especially if there is a change in a mole's shape or color.  Also tell your health care provider if you have a mole that is larger than the size of a pencil eraser.  Always use sunscreen. Apply sunscreen liberally and repeatedly throughout the day.  Protect yourself by wearing long sleeves, pants, a wide-brimmed hat, and sunglasses whenever you are outside. HEART DISEASE, DIABETES, AND HIGH BLOOD PRESSURE   High blood pressure causes heart disease and increases the risk of stroke. High blood pressure is more likely to develop in:  People who have blood pressure in the high end   of the normal range (130-139/85-89 mm Hg).  People who are overweight or obese.  People who are African American.  If you are 38-23 years of age, have your blood pressure checked every 3-5 years. If you are 61 years of age or older, have your blood pressure checked every year. You should have your blood pressure measured twice--once when you are at a hospital or clinic, and once when you are not at a hospital or clinic. Record the average of the two measurements. To check your blood pressure when you are not at a hospital or clinic, you can use:  An automated blood pressure machine at a pharmacy.  A home blood pressure monitor.  If you are between 45 years and 39 years old, ask your health care provider if you should take aspirin to prevent strokes.  Have regular diabetes screenings. This involves taking a blood sample to check your fasting blood sugar level.  If you are at a normal weight and have a low risk for diabetes,  have this test once every three years after 48 years of age.  If you are overweight and have a high risk for diabetes, consider being tested at a younger age or more often. PREVENTING INFECTION  Hepatitis B  If you have a higher risk for hepatitis B, you should be screened for this virus. You are considered at high risk for hepatitis B if:  You were born in a country where hepatitis B is common. Ask your health care provider which countries are considered high risk.  Your parents were born in a high-risk country, and you have not been immunized against hepatitis B (hepatitis B vaccine).  You have HIV or AIDS.  You use needles to inject street drugs.  You live with someone who has hepatitis B.  You have had sex with someone who has hepatitis B.  You get hemodialysis treatment.  You take certain medicines for conditions, including cancer, organ transplantation, and autoimmune conditions. Hepatitis C  Blood testing is recommended for:  Everyone born from [birth year] through 1964.  Anyone with known risk factors for hepatitis C. Sexually transmitted infections (STIs)  You should be screened for sexually transmitted infections (STIs) including gonorrhea and chlamydia if:  You are sexually active and are younger than 48 years of age.  You are older than 48 years of age and your health care provider tells you that you are at risk for this type of infection.  Your sexual activity has changed since you were last screened and you are at an increased risk for chlamydia or gonorrhea. Ask your health care provider if you are at risk.  If you do not have HIV, but are at risk, it may be recommended that you take a prescription medicine daily to prevent HIV infection. This is called pre-exposure prophylaxis (PrEP). You are considered at risk if:  You are sexually active and do not regularly use condoms or know the HIV status of your partner(s).  You take drugs by injection.  You are sexually  active with a partner who has HIV. Talk with your health care provider about whether you are at high risk of being infected with HIV. If you choose to begin PrEP, you should first be tested for HIV. You should then be tested every 3 months for as long as you are taking PrEP.  PREGNANCY   If you are premenopausal and you may become pregnant, ask your health care provider about preconception counseling.  If you may  become pregnant, take 400 to 800 micrograms (mcg) of folic acid every day.  If you want to prevent pregnancy, talk to your health care provider about birth control (contraception). OSTEOPOROSIS AND MENOPAUSE   Osteoporosis is a disease in which the bones lose minerals and strength with aging. This can result in serious bone fractures. Your risk for osteoporosis can be identified using a bone density scan.  If you are 61 years of age or older, or if you are at risk for osteoporosis and fractures, ask your health care provider if you should be screened.  Ask your health care provider whether you should take a calcium or vitamin D supplement to lower your risk for osteoporosis.  Menopause may have certain physical symptoms and risks.  Hormone replacement therapy may reduce some of these symptoms and risks. Talk to your health care provider about whether hormone replacement therapy is right for you.  HOME CARE INSTRUCTIONS   Schedule regular health, dental, and eye exams.  Stay current with your immunizations.   Do not use any tobacco products including cigarettes, chewing tobacco, or electronic cigarettes.  If you are pregnant, do not drink alcohol.  If you are breastfeeding, limit how much and how often you drink alcohol.  Limit alcohol intake to no more than 1 drink per day for nonpregnant women. One drink equals 12 ounces of beer, 5 ounces of wine, or 1 ounces of hard liquor.  Do not use street drugs.  Do not share needles.  Ask your health care provider for help if  you need support or information about quitting drugs.  Tell your health care provider if you often feel depressed.  Tell your health care provider if you have ever been abused or do not feel safe at home.   This information is not intended to replace advice given to you by your health care provider. Make sure you discuss any questions you have with your health care provider.   Document Released: 10/22/2010 Document Revised: 04/29/2014 Document Reviewed: 03/10/2013 Elsevier Interactive Patient Education [employer].

## 2015-03-24 LAB — CYTOLOGY - PAP

## 2015-04-05 ENCOUNTER — Ambulatory Visit (INDEPENDENT_AMBULATORY_CARE_PROVIDER_SITE_OTHER): Payer: 59 | Admitting: Podiatry

## 2015-04-05 ENCOUNTER — Ambulatory Visit (INDEPENDENT_AMBULATORY_CARE_PROVIDER_SITE_OTHER): Payer: 59

## 2015-04-05 ENCOUNTER — Encounter: Payer: Self-pay | Admitting: Podiatry

## 2015-04-05 VITALS — BP 114/66 | HR 58 | Resp 16

## 2015-04-05 DIAGNOSIS — M79673 Pain in unspecified foot: Secondary | ICD-10-CM

## 2015-04-05 DIAGNOSIS — M722 Plantar fascial fibromatosis: Secondary | ICD-10-CM

## 2015-04-05 DIAGNOSIS — M779 Enthesopathy, unspecified: Secondary | ICD-10-CM

## 2015-04-05 NOTE — Progress Notes (Signed)
   Subjective:    Patient ID: Robin Burns, female    DOB: 05-22-1965, 49 y.o.   MRN: [id number]  HPI Patient presents with bilateral foot pain, heel. Pt stated, "on Right foot, feels like need to crack foot to feel better". On Left foot, 2nd toe-pt stated, "got stung by a yellow jacket in June 2016 and toe still hurts".   Review of Systems  All other systems reviewed and are negative.      Objective:   Physical Exam        Assessment & Plan:

## 2015-04-05 NOTE — Patient Instructions (Signed)

## 2015-04-06 NOTE — Progress Notes (Signed)
Subjective:     Patient ID: Robin Burns, female   DOB: April 02, 1966, 49 y.o.   MRN: [id number]  HPI patient states I've worn orthotics in the past and I know I need them again and my left second toe has moved towards my big toe and up slightly and I'm not sure what might of happened and the only thing I can think of is I was bit by a bee. I do like to play tennis and my heels get sore   Review of Systems  All other systems reviewed and are negative.      Objective:   Physical Exam  Constitutional: She is oriented to person, place, and time.  Cardiovascular: Intact distal pulses.   Musculoskeletal: Normal range of motion.  Neurological: She is oriented to person, place, and time.  Skin: Skin is warm.  Nursing note and vitals reviewed.  neurovascular status intact muscle strength adequate range of motion within normal limits with patient noted to have medial and dorsal rotation of the second digit left with mild discomfort in the second metatarsophalangeal joint mild discomfort in the heels bilateral and moderate depression of the arch noted bilateral. 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 Hopefully this will not progress further but ultimately may require stabilization procedure and today I went ahead and I scanned for custom orthotics to reduce the stress on the heels and the forefoot and patient will be seen back when ready

## 2015-04-07 ENCOUNTER — Ambulatory Visit: Payer: 59 | Admitting: *Deleted

## 2015-04-07 DIAGNOSIS — M722 Plantar fascial fibromatosis: Secondary | ICD-10-CM

## 2015-04-07 NOTE — Progress Notes (Signed)
Patient ID: Robin Burns, female   DOB: March 02, 1966, 49 y.o.   MRN: [id number] Patient presents to be plaster casted for custom molded orthotics.

## 2015-05-02 ENCOUNTER — Ambulatory Visit: Payer: 59 | Admitting: *Deleted

## 2015-05-02 DIAGNOSIS — M722 Plantar fascial fibromatosis: Secondary | ICD-10-CM

## 2015-05-02 NOTE — Progress Notes (Signed)
Patient ID: Robin Burns, female   DOB: 1965-05-02, 50 y.o.   MRN: [id number] Patient presents for orthotic pick up.  Verbal and written break in and wear instructions given.  Patient will follow up in 4 weeks if symptoms worsen or fail to improve.

## 2015-05-02 NOTE — Patient Instructions (Signed)

## 2016-02-14 ENCOUNTER — Other Ambulatory Visit: Payer: Self-pay | Admitting: Gynecology

## 2016-02-14 DIAGNOSIS — Z1231 Encounter for screening mammogram for malignant neoplasm of breast: Secondary | ICD-10-CM

## 2016-02-15 ENCOUNTER — Ambulatory Visit (HOSPITAL_BASED_OUTPATIENT_CLINIC_OR_DEPARTMENT_OTHER)
Admission: RE | Admit: 2016-02-15 | Discharge: 2016-02-15 | Disposition: A | Discharge status: Home / Self Care | Payer: 59 | Source: Ambulatory Visit | Attending: Gynecology | Admitting: Gynecology

## 2016-02-15 DIAGNOSIS — Z1231 Encounter for screening mammogram for malignant neoplasm of breast: Secondary | ICD-10-CM | POA: Diagnosis not present

## 2016-03-06 ENCOUNTER — Ambulatory Visit: Payer: Self-pay

## 2016-03-06 ENCOUNTER — Ambulatory Visit (INDEPENDENT_AMBULATORY_CARE_PROVIDER_SITE_OTHER): Payer: 59 | Admitting: Sports Medicine

## 2016-03-06 ENCOUNTER — Encounter: Payer: Self-pay | Admitting: Sports Medicine

## 2016-03-06 VITALS — BP 116/82 | HR 69 | Ht 63.0 in | Wt 146.0 lb

## 2016-03-06 DIAGNOSIS — M25522 Pain in left elbow: Secondary | ICD-10-CM

## 2016-03-06 DIAGNOSIS — M7712 Lateral epicondylitis, left elbow: Secondary | ICD-10-CM | POA: Diagnosis not present

## 2016-03-06 MED ORDER — NITROGLYCERIN 0.2 MG/HR TD PT24: MEDICATED_PATCH | TRANSDERMAL | 1 refills | Status: DC

## 2016-03-06 NOTE — Assessment & Plan Note (Signed)
We started her on a home exercise program She will use a body helix compression for tennis Topical nitroglycerin protocol  Okay to play tennis but be cautious in other activities  Recheck 6 weeks

## 2016-03-06 NOTE — Progress Notes (Signed)
Chief complaint --left elbow pain  Patient is an active tennis player She has had at least 2 months of pain in her left elbow She thinks this was aggravated with her hand and wrist position of her backhand stroke This is also painful when she tries to hit a left-handed volley  No other injury to the hand She does not it becomes painful with doing gardening or other types of work It does hurt to lift anything or to hold a coffee cup Hurts to open a door  Referred by 2 of her friends in tennis whose elbow pain  improved with nitroglycerin  Social history - past history of smoking none recently  Past medical history - Occasional stress-related headaches that start on the right side of her trapezius No chronic medical illnesses Relief of shoulder tightness with Prolotherapy in the past  Review of systems Denies neck pain with movement Denies any radicular symptoms Left arm does not feel weak except secondary to pain  Physical examination Pleasant female in no acute distress BP 116/82   Pulse 69   Ht 5\' 3"  (1.6 m)   Wt 146 lb (66.2 kg)   BMI 25.86 kg/m   She demonstrates full flexion and extension of the left elbow Pain at the lateral epicondyle on full extension or flexion No weakness on wrist or finger extension that gives way secondary to pain No numbness over the fingers and the hand Positive book test only on the left  Ultrasound of the left elbow Radial head is visualized and is normal No joint effusion At the medial epicondyles there is hypoechoic change near the proximal insertion No spurring No tears noted She has significant increased Doppler activity and neo- vessels along the lateral epicondyle Remainder joint appears normal  Ultrasound and interpretation by Royal HawthornKarl Alyana Kreiter

## 2016-03-06 NOTE — Patient Instructions (Signed)
Nitroglycerin Protocol   Apply 1/4 nitroglycerin patch to affected area daily.  Change position of patch within the affected area every 24 hours.  You may experience a headache during the first 1-2 weeks of using the patch, these should subside.  If you experience headaches after beginning nitroglycerin patch treatment, you may take your preferred over the counter pain reliever.  Another side effect of the nitroglycerin patch is skin irritation or rash related to patch adhesive.  Please notify our office if you develop more severe headaches or rash, and stop the patch.  Tendon healing with nitroglycerin patch may require 12 to 24 weeks depending on the extent of injury.  Men should not use if taking Viagra, Cialis, or Levitra.   Do not use if you have migraines or rosacea.   Do elbow exercises as directed  Follow up as needed

## 2016-04-25 ENCOUNTER — Other Ambulatory Visit: Payer: 59 | Admitting: Sports Medicine

## 2016-06-13 ENCOUNTER — Other Ambulatory Visit: Payer: Self-pay | Admitting: Women's Health

## 2016-09-04 ENCOUNTER — Encounter: Payer: Self-pay | Admitting: Gynecology

## 2017-05-19 ENCOUNTER — Other Ambulatory Visit: Payer: Self-pay | Admitting: Women's Health

## 2017-05-19 DIAGNOSIS — Z1231 Encounter for screening mammogram for malignant neoplasm of breast: Secondary | ICD-10-CM

## 2017-05-20 ENCOUNTER — Ambulatory Visit (HOSPITAL_BASED_OUTPATIENT_CLINIC_OR_DEPARTMENT_OTHER)
Admission: RE | Admit: 2017-05-20 | Discharge: 2017-05-20 | Disposition: A | Discharge status: Home / Self Care | Payer: 59 | Source: Ambulatory Visit | Attending: Women's Health | Admitting: Women's Health

## 2017-05-20 DIAGNOSIS — Z1231 Encounter for screening mammogram for malignant neoplasm of breast: Secondary | ICD-10-CM | POA: Diagnosis not present

## 2017-08-04 DIAGNOSIS — M25562 Pain in left knee: Secondary | ICD-10-CM | POA: Insufficient documentation

## 2017-09-03 ENCOUNTER — Other Ambulatory Visit: Payer: Self-pay | Admitting: Orthopedic Surgery

## 2017-09-03 DIAGNOSIS — M25562 Pain in left knee: Secondary | ICD-10-CM

## 2017-09-06 ENCOUNTER — Ambulatory Visit
Admission: RE | Admit: 2017-09-06 | Discharge: 2017-09-06 | Disposition: A | Discharge status: Home / Self Care | Payer: 59 | Source: Ambulatory Visit | Attending: Orthopedic Surgery | Admitting: Orthopedic Surgery

## 2017-09-06 DIAGNOSIS — M25562 Pain in left knee: Secondary | ICD-10-CM

## 2017-09-09 ENCOUNTER — Encounter: Payer: Self-pay | Admitting: Women's Health

## 2017-09-09 ENCOUNTER — Ambulatory Visit (INDEPENDENT_AMBULATORY_CARE_PROVIDER_SITE_OTHER): Payer: 59 | Admitting: Women's Health

## 2017-09-09 VITALS — BP 118/76 | Ht 63.0 in | Wt 158.0 lb

## 2017-09-09 DIAGNOSIS — Z13220 Encounter for screening for lipoid disorders: Secondary | ICD-10-CM

## 2017-09-09 DIAGNOSIS — Z01419 Encounter for gynecological examination (general) (routine) without abnormal findings: Secondary | ICD-10-CM

## 2017-09-09 DIAGNOSIS — N898 Other specified noninflammatory disorders of vagina: Secondary | ICD-10-CM

## 2017-09-09 DIAGNOSIS — Z1151 Encounter for screening for human papillomavirus (HPV): Secondary | ICD-10-CM | POA: Diagnosis not present

## 2017-09-09 DIAGNOSIS — N946 Dysmenorrhea, unspecified: Secondary | ICD-10-CM | POA: Diagnosis not present

## 2017-09-09 LAB — COMPREHENSIVE METABOLIC PANEL
AG Ratio: 1.7 (calc) (ref 1.0–2.5)
ALKALINE PHOSPHATASE (APISO): 31 U/L — AB (ref 33–130)
ALT: 15 U/L (ref 6–29)
AST: 17 U/L (ref 10–35)
Albumin: 4.1 g/dL (ref 3.6–5.1)
BUN: 16 mg/dL (ref 7–25)
CHLORIDE: 107 mmol/L (ref 98–110)
CO2: 25 mmol/L (ref 20–32)
CREATININE: 0.86 mg/dL (ref 0.50–1.05)
Calcium: 9.6 mg/dL (ref 8.6–10.4)
GLOBULIN: 2.4 g/dL (ref 1.9–3.7)
Glucose, Bld: 84 mg/dL (ref 65–99)
Potassium: 4.2 mmol/L (ref 3.5–5.3)
Sodium: 139 mmol/L (ref 135–146)
Total Bilirubin: 0.5 mg/dL (ref 0.2–1.2)
Total Protein: 6.5 g/dL (ref 6.1–8.1)

## 2017-09-09 LAB — WET PREP FOR TRICH, YEAST, CLUE

## 2017-09-09 LAB — CBC WITH DIFFERENTIAL/PLATELET
BASOS ABS: 41 {cells}/uL (ref 0–200)
BASOS PCT: 0.7 %
EOS ABS: 89 {cells}/uL (ref 15–500)
Eosinophils Relative: 1.5 %
HCT: 38.3 % (ref 35.0–45.0)
Hemoglobin: 12.9 g/dL (ref 11.7–15.5)
Lymphs Abs: 1682 cells/uL (ref 850–3900)
MCH: 29.3 pg (ref 27.0–33.0)
MCHC: 33.7 g/dL (ref 32.0–36.0)
MCV: 86.8 fL (ref 80.0–100.0)
MPV: 10 fL (ref 7.5–12.5)
Monocytes Relative: 6.8 %
Neutro Abs: 3688 cells/uL (ref 1500–7800)
Neutrophils Relative %: 62.5 %
PLATELETS: 291 10*3/uL (ref 140–400)
RBC: 4.41 10*6/uL (ref 3.80–5.10)
RDW: 12.4 % (ref 11.0–15.0)
TOTAL LYMPHOCYTE: 28.5 %
WBC: 5.9 10*3/uL (ref 3.8–10.8)
WBCMIX: 401 {cells}/uL (ref 200–950)

## 2017-09-09 LAB — LIPID PANEL
CHOL/HDL RATIO: 3.1 (calc) (ref <5.0)
CHOLESTEROL: 211 mg/dL — AB (ref <200)
HDL: 67 mg/dL (ref >50)
LDL Cholesterol (Calc): 127 mg/dL (calc) — ABNORMAL HIGH
Non-HDL Cholesterol (Calc): 144 mg/dL (calc) — ABNORMAL HIGH (ref <130)
Triglycerides: 80 mg/dL (ref <150)

## 2017-09-09 MED ORDER — IBUPROFEN 800 MG PO TABS: 800.0000 mg | ORAL_TABLET | Freq: Three times a day (TID) | ORAL | 1 refills | Status: AC | PRN

## 2017-09-09 MED ORDER — FLUCONAZOLE 150 MG PO TABS: 150.0000 mg | ORAL_TABLET | Freq: Once | ORAL | 1 refills | Status: AC

## 2017-09-09 NOTE — Addendum Note (Signed)
Addended by: Dayna Barker on: 09/09/2017 01:55 PM   Modules accepted: Orders

## 2017-09-09 NOTE — Patient Instructions (Addendum)
[facility]  Dr Gessner  [phone]  Health Maintenance, Female Adopting a healthy lifestyle and getting preventive care can go a long way to promote health and wellness. Talk with your health care provider about what schedule of regular examinations is right for you. This is a good chance for you to check in with your provider about disease prevention and staying healthy. In between checkups, there are plenty of things you can do on your own. Experts have done a lot of research about which lifestyle changes and preventive measures are most likely to keep you healthy. Ask your health care provider for more information. Weight and diet Eat a healthy diet  Be sure to include plenty of vegetables, fruits, low-fat dairy products, and lean protein.  Do not eat a lot of foods high in solid fats, added sugars, or salt.  Get regular exercise. This is one of the most important things you can do for your health. ? Most adults should exercise for at least 150 minutes each week. The exercise should increase your heart rate and make you sweat (moderate-intensity exercise). ? Most adults should also do strengthening exercises at least twice a week. This is in addition to the moderate-intensity exercise.  Maintain a healthy weight  Body mass index (BMI) is a measurement that can be used to identify possible weight problems. It estimates body fat based on height and weight. Your health care provider can help determine your BMI and help you achieve or maintain a healthy weight.  For females 20 years of age and older: ? A BMI below 18.5 is considered underweight. ? A BMI of 18.5 to 24.9 is normal. ? A BMI of 25 to 29.9 is considered overweight. ? A BMI of 30 and above is considered obese.  Watch levels of cholesterol and blood lipids  You should start having your blood tested for lipids and cholesterol at 51 years of age, then have this test every 5 years.  You may need to have your cholesterol levels  checked more often if: ? Your lipid or cholesterol levels are high. ? You are older than 50 years of age. ? You are at high risk for heart disease.  Cancer screening Lung Cancer  Lung cancer screening is recommended for adults 55-80 years old who are at high risk for lung cancer because of a history of smoking.  A yearly low-dose CT scan of the lungs is recommended for people who: ? Currently smoke. ? Have quit within the past 15 years. ? Have at least a 30-pack-year history of smoking. A pack year is smoking an average of one pack of cigarettes a day for 1 year.  Yearly screening should continue until it has been 15 years since you quit.  Yearly screening should stop if you develop a health problem that would prevent you from having lung cancer treatment.  Breast Cancer  Practice breast self-awareness. This means understanding how your breasts normally appear and feel.  It also means doing regular breast self-exams. Let your health care provider know about any changes, no matter how small.  If you are in your 20s or 30s, you should have a clinical breast exam (CBE) by a health care provider every 1-3 years as part of a regular health exam.  If you are 40 or older, have a CBE every year. Also consider having a breast X-ray (mammogram) every year.  If you have a family history of breast cancer, talk to your health care provider about genetic   screening.  If you are at high risk for breast cancer, talk to your health care provider about having an MRI and a mammogram every year.  Breast cancer gene (BRCA) assessment is recommended for women who have family members with BRCA-related cancers. BRCA-related cancers include: ? Breast. ? Ovarian. ? Tubal. ? Peritoneal cancers.  Results of the assessment will determine the need for genetic counseling and BRCA1 and BRCA2 testing.  Cervical Cancer Your health care provider may recommend that you be screened regularly for cancer of the  pelvic organs (ovaries, uterus, and vagina). This screening involves a pelvic examination, including checking for microscopic changes to the surface of your cervix (Pap test). You may be encouraged to have this screening done every 3 years, beginning at age 21.  For women ages 30-65, health care providers may recommend pelvic exams and Pap testing every 3 years, or they may recommend the Pap and pelvic exam, combined with testing for human papilloma virus (HPV), every 5 years. Some types of HPV increase your risk of cervical cancer. Testing for HPV may also be done on women of any age with unclear Pap test results.  Other health care providers may not recommend any screening for nonpregnant women who are considered low risk for pelvic cancer and who do not have symptoms. Ask your health care provider if a screening pelvic exam is right for you.  If you have had past treatment for cervical cancer or a condition that could lead to cancer, you need Pap tests and screening for cancer for at least 20 years after your treatment. If Pap tests have been discontinued, your risk factors (such as having a new sexual partner) need to be reassessed to determine if screening should resume. Some women have medical problems that increase the chance of getting cervical cancer. In these cases, your health care provider may recommend more frequent screening and Pap tests.  Colorectal Cancer  This type of cancer can be detected and often prevented.  Routine colorectal cancer screening usually begins at 50 years of age and continues through 51 years of age.  Your health care provider may recommend screening at an earlier age if you have risk factors for colon cancer.  Your health care provider may also recommend using home test kits to check for hidden blood in the stool.  A small camera at the end of a tube can be used to examine your colon directly (sigmoidoscopy or colonoscopy). This is done to check for the  earliest forms of colorectal cancer.  Routine screening usually begins at age 50.  Direct examination of the colon should be repeated every 5-10 years through 51 years of age. However, you may need to be screened more often if early forms of precancerous polyps or small growths are found.  Skin Cancer  Check your skin from head to toe regularly.  Tell your health care provider about any new moles or changes in moles, especially if there is a change in a mole's shape or color.  Also tell your health care provider if you have a mole that is larger than the size of a pencil eraser.  Always use sunscreen. Apply sunscreen liberally and repeatedly throughout the day.  Protect yourself by wearing long sleeves, pants, a wide-brimmed hat, and sunglasses whenever you are outside.  Heart disease, diabetes, and high blood pressure  High blood pressure causes heart disease and increases the risk of stroke. High blood pressure is more likely to develop in: ? People who   have blood pressure in the high end of the normal range (130-139/85-89 mm Hg). ? People who are overweight or obese. ? People who are African American.  If you are 19-29 years of age, have your blood pressure checked every 3-5 years. If you are 30 years of age or older, have your blood pressure checked every year. You should have your blood pressure measured twice-once when you are at a hospital or clinic, and once when you are not at a hospital or clinic. Record the average of the two measurements. To check your blood pressure when you are not at a hospital or clinic, you can use: ? An automated blood pressure machine at a pharmacy. ? A home blood pressure monitor.  If you are between 65 years and 63 years old, ask your health care provider if you should take aspirin to prevent strokes.  Have regular diabetes screenings. This involves taking a blood sample to check your fasting blood sugar level. ? If you are at a normal weight and  have a low risk for diabetes, have this test once every three years after 51 years of age. ? If you are overweight and have a high risk for diabetes, consider being tested at a younger age or more often. Preventing infection Hepatitis B  If you have a higher risk for hepatitis B, you should be screened for this virus. You are considered at high risk for hepatitis B if: ? You were born in a country where hepatitis B is common. Ask your health care provider which countries are considered high risk. ? Your parents were born in a high-risk country, and you have not been immunized against hepatitis B (hepatitis B vaccine). ? You have HIV or AIDS. ? You use needles to inject street drugs. ? You live with someone who has hepatitis B. ? You have had sex with someone who has hepatitis B. ? You get hemodialysis treatment. ? You take certain medicines for conditions, including cancer, organ transplantation, and autoimmune conditions.  Hepatitis C  Blood testing is recommended for: ? Everyone born from [birth year] through 1965. ? Anyone with known risk factors for hepatitis C.  Sexually transmitted infections (STIs)  You should be screened for sexually transmitted infections (STIs) including gonorrhea and chlamydia if: ? You are sexually active and are younger than 51 years of age. ? You are older than 51 years of age and your health care provider tells you that you are at risk for this type of infection. ? Your sexual activity has changed since you were last screened and you are at an increased risk for chlamydia or gonorrhea. Ask your health care provider if you are at risk.  If you do not have HIV, but are at risk, it may be recommended that you take a prescription medicine daily to prevent HIV infection. This is called pre-exposure prophylaxis (PrEP). You are considered at risk if: ? You are sexually active and do not regularly use condoms or know the HIV status of your partner(s). ? You take drugs by  injection. ? You are sexually active with a partner who has HIV.  Talk with your health care provider about whether you are at high risk of being infected with HIV. If you choose to begin PrEP, you should first be tested for HIV. You should then be tested every 3 months for as long as you are taking PrEP. Pregnancy  If you are premenopausal and you may become pregnant, ask your health care provider  about preconception counseling.  If you may become pregnant, take 400 to 800 micrograms (mcg) of folic acid every day.  If you want to prevent pregnancy, talk to your health care provider about birth control (contraception). Osteoporosis and menopause  Osteoporosis is a disease in which the bones lose minerals and strength with aging. This can result in serious bone fractures. Your risk for osteoporosis can be identified using a bone density scan.  If you are 6 years of age or older, or if you are at risk for osteoporosis and fractures, ask your health care provider if you should be screened.  Ask your health care provider whether you should take a calcium or vitamin D supplement to lower your risk for osteoporosis.  Menopause may have certain physical symptoms and risks.  Hormone replacement therapy may reduce some of these symptoms and risks. Talk to your health care provider about whether hormone replacement therapy is right for you. Follow these instructions at home:  Schedule regular health, dental, and eye exams.  Stay current with your immunizations.  Do not use any tobacco products including cigarettes, chewing tobacco, or electronic cigarettes.  If you are pregnant, do not drink alcohol.  If you are breastfeeding, limit how much and how often you drink alcohol.  Limit alcohol intake to no more than 1 drink per day for nonpregnant women. One drink equals 12 ounces of beer, 5 ounces of wine, or 1 ounces of hard liquor.  Do not use street drugs.  Do not share needles.  Ask  your health care provider for help if you need support or information about quitting drugs.  Tell your health care provider if you often feel depressed.  Tell your health care provider if you have ever been abused or do not feel safe at home. This information is not intended to replace advice given to you by your health care provider. Make sure you discuss any questions you have with your health care provider. Document Released: 10/22/2010 Document Revised: 09/14/2015 Document Reviewed: 01/10/2015 Elsevier Interactive Patient Education  [employer].

## 2017-09-09 NOTE — Progress Notes (Signed)
Nike [place] [birth year] [id number]    History:    Presents for annual exam.  Cycles every 3 weeks for 6 to 7 days with heavy flow changing tampon with pad every 2 hours on 2-3 days, vasectomy.  Has had problems with menorrhagia for several years but appears to be increasing.  Mother approximately 61 at menopause.  Normal Pap and mammogram history.  Currently has a left knee medial meniscus tear has follow-up scheduled.  Recent vacation after being in a hot tub vaginal irritation occurred.  Past medical history, past surgical history, family history and social history were all reviewed and documented in the EPIC chart.  Homemaker, helpa manage rental properties.  2 sons ages 62 and 39 they play ice hockey, both doing well.  ROS:  A ROS was performed and pertinent positives and negatives are included.  Exam:  Vitals:   09/09/17 1204  Weight: 158 lb (71.7 kg)  Height:  (1.6 m)   Body mass index is 27.99 kg/m.   General appearance:  Normal Thyroid:  Symmetrical, normal in size, without palpable masses or nodularity. Respiratory  Auscultation:  Clear without wheezing or rhonchi Cardiovascular  Auscultation:  Regular rate, without rubs, murmurs or gallops  Edema/varicosities:  Not grossly evident Abdominal  Soft,nontender, without masses, guarding or rebound.  Liver/spleen:  No organomegaly noted  Hernia:  None appreciated  Skin  Inspection:  Grossly normal   Breasts: Examined lying and sitting.     Right: Without masses, retractions, discharge or axillary adenopathy.     Left: Without masses, retractions, discharge or axillary adenopathy. Gentitourinary   Inguinal/mons:  Normal without inguinal adenopathy  External genitalia: Erythemic  BUS/Urethra/Skene's glands:  Normal  Vagina: Scant discharge mild erythema prep negative   Cervix:  Normal  Uterus:   normal in size, shape and contour.  Midline and mobile  Adnexa/parametria:     Rt: Without masses or  tenderness.   Lt: Without masses or tenderness.  Anus and perineum: Normal  Digital rectal exam: Normal sphincter tone without palpated masses or tenderness  Assessment/Plan:  52 y.o. MWF G3, P2 for annual exam with menorrhagia and vaginal irritation for the past 2 weeks  Monthly cycle/menorrhagia/vasectomy Left knee meniscal tear-orthopedist follow-up Vaginal irritation probable yeast  Plan: Options reviewed for menorrhagia, schedule sonohysterogram with Dr. Audie Box, endometrial ablation discussed would like to proceed.  We will check insurance benefits.  Diflucan 150 p.o. x1 dose prescription given, instructed to call if continued vaginal irritation.  SBE's , continue annual screening mammogram, calcium rich foods, vitamin D 2000 daily encouraged.  Continue healthy lifestyle of regular exercise, tennis.  CBC, CMP, lipid panel, Pap with HR HPV typing, new screening guidelines reviewed.    Harrington Challenger [facility], 12:10 PM 09/09/2017

## 2017-09-10 ENCOUNTER — Other Ambulatory Visit: Payer: Self-pay | Admitting: Women's Health

## 2017-09-10 DIAGNOSIS — N920 Excessive and frequent menstruation with regular cycle: Secondary | ICD-10-CM

## 2017-09-10 LAB — PAP, TP IMAGING W/ HPV RNA, RFLX HPV TYPE 16,18/45: HPV DNA High Risk: NOT DETECTED

## 2017-09-11 DIAGNOSIS — S83242A Other tear of medial meniscus, current injury, left knee, initial encounter: Secondary | ICD-10-CM | POA: Insufficient documentation

## 2017-09-23 DIAGNOSIS — Z9889 Other specified postprocedural states: Secondary | ICD-10-CM | POA: Insufficient documentation

## 2017-09-24 ENCOUNTER — Other Ambulatory Visit: Payer: Self-pay | Admitting: Gynecology

## 2017-09-24 DIAGNOSIS — N939 Abnormal uterine and vaginal bleeding, unspecified: Secondary | ICD-10-CM

## 2017-09-24 DIAGNOSIS — N920 Excessive and frequent menstruation with regular cycle: Secondary | ICD-10-CM

## 2017-10-01 ENCOUNTER — Telehealth: Payer: Self-pay | Admitting: *Deleted

## 2017-10-01 NOTE — Telephone Encounter (Signed)
(  late entry from 09/30/17)Patient called and left message in triage voicemail that she didn't want to proceed with ablation on 10/07/17. I called patient back and informed her [facility], patient said she was confused, felt better since no ablation was scheduled.

## 2017-10-03 ENCOUNTER — Telehealth: Payer: Self-pay | Admitting: *Deleted

## 2017-10-03 NOTE — Telephone Encounter (Signed)
(  pt aware you are out of the office)[facility], called asking if having Mirena IUD inserted was options verse having [facility] done on 10/07/17. Had mirena IUD in past, I did explain this may not be an options as [facility] polyp etc. I did explain the process prior to IUD placement such as benefits must be checked etc. Please advise

## 2017-10-05 NOTE — Telephone Encounter (Signed)
Yes, please call and review we could try the mirena to see if we can get flow to decrease.  She has cycles every 21-23 days for 7 days 3 of which are heavy. Please call and check coverage and Dr Audie BoxFontaine to place, husband vasectomy so for menorrhagia, tell her sometimes it is not covered if not for contraception,  If the sonohysterogram is normal, Dr Audie BoxFontaine can proceed with scheduling an ablation which also can slow the bleeding.

## 2017-10-06 NOTE — Telephone Encounter (Signed)
Pt informed, SHGM canceled. Pt aware wendy will call her once benefits checked.

## 2017-10-06 NOTE — Telephone Encounter (Signed)
I tried to call p.m., no answer.  Yes we could cancel the sonohysterogram, received with Mirena if it is covered.  Were leaning towards an ablation, she is not having bleeding between cycles just heavy cycles.

## 2017-10-06 NOTE — Telephone Encounter (Signed)
Robin Sineancy do you [facility] or have wendy check benefits for Mirena IUD and cancel [facility]GM? Please advise

## 2017-10-06 NOTE — Telephone Encounter (Signed)
Left message for pt to call.

## 2017-10-07 ENCOUNTER — Ambulatory Visit: Payer: 59 | Admitting: Gynecology

## 2017-10-07 ENCOUNTER — Other Ambulatory Visit: Payer: 59

## 2017-11-19 ENCOUNTER — Ambulatory Visit (INDEPENDENT_AMBULATORY_CARE_PROVIDER_SITE_OTHER): Payer: 59 | Admitting: Gynecology

## 2017-11-19 ENCOUNTER — Encounter: Payer: Self-pay | Admitting: Gynecology

## 2017-11-19 VITALS — BP 110/80

## 2017-11-19 DIAGNOSIS — Z30430 Encounter for insertion of intrauterine contraceptive device: Secondary | ICD-10-CM | POA: Diagnosis not present

## 2017-11-19 HISTORY — PX: INTRAUTERINE DEVICE INSERTION: SHX323

## 2017-11-19 NOTE — Progress Notes (Signed)
    [facility] 03-01-1966 [id number]        52 y.o.  [id number]  presents for Mirena IUD placement having recently seen [name].  History of heavier menses which they have always been.  Has had IUD in the past which controlled her bleeding.  Requested IUD now due to her heavier menses.  Mother underwent menopause at age 51.  Was contemplating ablation but decided to proceed with the IUD.  She has read through the booklet, has no contraindications and signed the consent form. She just started a normal menses yesterday..  I reviewed the insertional process with her as well as the risks to include infection, either immediate or long-term, uterine perforation or migration requiring surgery to remove, other complications such as pain, hormonal side effects, infertility and possibility of failure with subsequent pregnancy.   Exam with Bari MantisKim Alexis assistant Vitals:   11/19/17 1215  BP: 110/80    Pelvic: External BUS vagina normal. Cervix normal with light menses flow. Uterus anteverted grossly normal size shape contour midline mobile nontender. Adnexa without masses or tenderness.  Procedure: The cervix was cleansed with Betadine, anterior lip grasped with a single-tooth tenaculum, the cervix was gently dilated with a disposable dilator and the uterus was sounded and a Mirena IUD was placed according to manufacturer's recommendations without difficulty. The strings were trimmed. The patient tolerated well and will follow up in one month for a postinsertional check.  Lot number:  [id number]    Dara Lordsimothy P Jenissa Tyrell MD, 12:40 PM 11/19/2017

## 2017-11-19 NOTE — Patient Instructions (Signed)

## 2017-11-24 ENCOUNTER — Encounter: Payer: Self-pay | Admitting: Gynecology

## 2017-12-17 ENCOUNTER — Encounter: Payer: Self-pay | Admitting: Women's Health

## 2017-12-17 ENCOUNTER — Ambulatory Visit (INDEPENDENT_AMBULATORY_CARE_PROVIDER_SITE_OTHER): Payer: 59 | Admitting: Women's Health

## 2017-12-17 VITALS — BP 120/82

## 2017-12-17 DIAGNOSIS — Z30431 Encounter for routine checking of intrauterine contraceptive device: Secondary | ICD-10-CM | POA: Diagnosis not present

## 2017-12-17 NOTE — Progress Notes (Signed)
52 year old MWF G3 P2 on 11/19/2017 had a Mirena IUD placed by Dr. Audie BoxFontaine for menorrhagia, having cycles every 3 weeks for 6 to 7 days with heavy flow.  Currently on first cycle since placement had less cramping.  Denies vaginal discharge, urinary symptoms, dyspareunia.  Exam: Appears well.  External  genitalia within normal limits, speculum exam, IUD strings visible appropriate length.  Bimanual no CMT or adnexal tenderness.  IUD checkup  Plan: Aware Mirena IUD is good for 5 years will call if problems with dysmenorrhea or menorrhagia.

## 2018-02-26 ENCOUNTER — Other Ambulatory Visit: Payer: Self-pay | Admitting: Orthopedic Surgery

## 2018-02-26 DIAGNOSIS — M2390 Unspecified internal derangement of unspecified knee: Secondary | ICD-10-CM

## 2018-02-26 DIAGNOSIS — R52 Pain, unspecified: Secondary | ICD-10-CM

## 2018-02-26 DIAGNOSIS — R531 Weakness: Secondary | ICD-10-CM

## 2018-03-07 ENCOUNTER — Ambulatory Visit
Admission: RE | Admit: 2018-03-07 | Discharge: 2018-03-07 | Disposition: A | Discharge status: Home / Self Care | Payer: 59 | Source: Ambulatory Visit | Attending: Orthopedic Surgery | Admitting: Orthopedic Surgery

## 2018-03-07 DIAGNOSIS — R52 Pain, unspecified: Secondary | ICD-10-CM

## 2018-03-07 DIAGNOSIS — R531 Weakness: Secondary | ICD-10-CM

## 2018-03-07 DIAGNOSIS — M2390 Unspecified internal derangement of unspecified knee: Secondary | ICD-10-CM

## 2019-01-13 ENCOUNTER — Encounter: Payer: Self-pay | Admitting: Gynecology

## 2020-02-22 ENCOUNTER — Other Ambulatory Visit: Payer: Self-pay | Admitting: Nurse Practitioner

## 2020-02-22 DIAGNOSIS — Z1231 Encounter for screening mammogram for malignant neoplasm of breast: Secondary | ICD-10-CM

## 2020-03-06 ENCOUNTER — Other Ambulatory Visit: Payer: Self-pay

## 2020-03-06 ENCOUNTER — Ambulatory Visit
Admission: RE | Admit: 2020-03-06 | Discharge: 2020-03-06 | Disposition: A | Discharge status: Home / Self Care | Payer: 59 | Source: Ambulatory Visit | Attending: Nurse Practitioner | Admitting: Nurse Practitioner

## 2020-03-06 DIAGNOSIS — Z1231 Encounter for screening mammogram for malignant neoplasm of breast: Secondary | ICD-10-CM

## 2020-03-15 ENCOUNTER — Other Ambulatory Visit: Payer: Self-pay

## 2020-03-15 ENCOUNTER — Encounter: Payer: Self-pay | Admitting: Nurse Practitioner

## 2020-03-15 ENCOUNTER — Ambulatory Visit (INDEPENDENT_AMBULATORY_CARE_PROVIDER_SITE_OTHER): Payer: 59 | Admitting: Nurse Practitioner

## 2020-03-15 VITALS — BP 120/78 | Ht 63.0 in | Wt 153.0 lb

## 2020-03-15 DIAGNOSIS — Z13220 Encounter for screening for lipoid disorders: Secondary | ICD-10-CM | POA: Diagnosis not present

## 2020-03-15 DIAGNOSIS — R232 Flushing: Secondary | ICD-10-CM

## 2020-03-15 DIAGNOSIS — Z01419 Encounter for gynecological examination (general) (routine) without abnormal findings: Secondary | ICD-10-CM

## 2020-03-15 DIAGNOSIS — Z30431 Encounter for routine checking of intrauterine contraceptive device: Secondary | ICD-10-CM | POA: Diagnosis not present

## 2020-03-15 LAB — CBC WITH DIFFERENTIAL/PLATELET
Basophils Relative: 0.7 %
Eosinophils Relative: 1.8 %
Hemoglobin: 13.9 g/dL (ref 11.7–15.5)
Lymphs Abs: 1323 cells/uL (ref 850–3900)
Platelets: 230 10*3/uL (ref 140–400)
RDW: 12.2 % (ref 11.0–15.0)
Total Lymphocyte: 29.4 %
WBC: 4.5 10*3/uL (ref 3.8–10.8)

## 2020-03-15 NOTE — Patient Instructions (Addendum)
Menopause °Menopause is the normal time of life when menstrual periods stop completely. It is usually confirmed by 12 months without a menstrual period. The transition to menopause (perimenopause) most often happens between the ages of 45 and 55. During perimenopause, hormone levels change in your body, which can cause symptoms and affect your health. Menopause may increase your risk for: °· Loss of bone (osteoporosis), which causes bone breaks (fractures). °· Depression. °· Hardening and narrowing of the arteries (atherosclerosis), which can cause heart attacks and strokes. °What are the causes? °This condition is usually caused by a natural change in hormone levels that happens as you get older. The condition may also be caused by surgery to remove both ovaries (bilateral oophorectomy). °What increases the risk? °This condition is more likely to start at an earlier age if you have certain medical conditions or treatments, including: °· A tumor of the pituitary gland in the brain. °· A disease that affects the ovaries and hormone production. °· Radiation treatment for cancer. °· Certain cancer treatments, such as chemotherapy or hormone (anti-estrogen) therapy. °· Heavy smoking and excessive alcohol use. °· Family history of early menopause. °This condition is also more likely to develop earlier in women who are very thin. °What are the signs or symptoms? °Symptoms of this condition include: °· Hot flashes. °· Irregular menstrual periods. °· Night sweats. °· Changes in feelings about sex. This could be a decrease in sex drive or an increased comfort around your sexuality. °· Vaginal dryness and thinning of the vaginal walls. This may cause painful intercourse. °· Dryness of the skin and development of wrinkles. °· Headaches. °· Problems sleeping (insomnia). °· Mood swings or irritability. °· Memory problems. °· Weight gain. °· Hair growth on the face and chest. °· Bladder infections or problems with urinating. °How  is this diagnosed? °This condition is diagnosed based on your medical history, a physical exam, your age, your menstrual history, and your symptoms. Hormone tests may also be done. °How is this treated? °In some cases, no treatment is needed. You and your health care provider should make a decision together about whether treatment is necessary. Treatment will be based on your individual condition and preferences. Treatment for this condition focuses on managing symptoms. Treatment may include: °· Menopausal hormone therapy (MHT). °· Medicines to treat specific symptoms or complications. °· Acupuncture. °· Vitamin or herbal supplements. °Before starting treatment, make sure to let your health care provider know if you have a personal or family history of: °· Heart disease. °· Breast cancer. °· Blood clots. °· Diabetes. °· Osteoporosis. °Follow these instructions at home: °Lifestyle °· Do not use any products that contain nicotine or tobacco, such as cigarettes and e-cigarettes. If you need help quitting, ask your health care provider. °· Get at least 30 minutes of physical activity on 5 or more days each week. °· Avoid alcoholic and caffeinated beverages, as well as spicy foods. This may help prevent hot flashes. °· Get 7-8 hours of sleep each night. °· If you have hot flashes, try: °? Dressing in layers. °? Avoiding things that may trigger hot flashes, such as spicy food, warm places, or stress. °? Taking slow, deep breaths when a hot flash starts. °? Keeping a fan in your home and office. °· Find ways to manage stress, such as deep breathing, meditation, or journaling. °· Consider going to group therapy with other women who are having menopause symptoms. Ask your health care provider about recommended group therapy meetings. °Eating and   drinking °· Eat a healthy, balanced diet that contains whole grains, lean protein, low-fat dairy, and plenty of fruits and vegetables. °· Your health care provider may recommend  adding more soy to your diet. Foods that contain soy include tofu, tempeh, and soy milk. °· Eat plenty of foods that contain calcium and vitamin D for bone health. Items that are rich in calcium include low-fat milk, yogurt, beans, almonds, sardines, broccoli, and kale. °Medicines °· Take over-the-counter and prescription medicines only as told by your health care provider. °· Talk with your health care provider before starting any herbal supplements. If prescribed, take vitamins and supplements as told by your health care provider. These may include: °? Calcium. Women age 51 and older should get 1,200 mg (milligrams) of calcium every day. °? Vitamin D. Women need 600-800 International Units of vitamin D each day. °? Vitamins B12 and B6. Aim for 50 micrograms of B12 and 1.5 mg of B6 each day. °General instructions °· Keep track of your menstrual periods, including: °? When they occur. °? How heavy they are and how long they last. °? How much time passes between periods. °· Keep track of your symptoms, noting when they start, how often you have them, and how long they last. °· Use vaginal lubricants or moisturizers to help with vaginal dryness and improve comfort during sex. °· Keep all follow-up visits as told by your health care provider. This is important. This includes any group therapy or counseling. °Contact a health care provider if: °· You are still having menstrual periods after age 55. °· You have pain during sex. °· You have not had a period for 12 months and you develop vaginal bleeding. °Get help right away if: °· You have: °? Severe depression. °? Excessive vaginal bleeding. °? Pain when you urinate. °? A fast or irregular heart beat (palpitations). °? Severe headaches. °? Abdomen (abdominal) pain or severe indigestion. °· You fell and you think you have a broken bone. °· You develop leg or chest pain. °· You develop vision problems. °· You feel a lump in your breast. °Summary °· Menopause is the normal  time of life when menstrual periods stop completely. It is usually confirmed by 12 months without a menstrual period. °· The transition to menopause (perimenopause) most often happens between the ages of 45 and 55. °· Symptoms can be managed through medicines, lifestyle changes, and complementary therapies such as acupuncture. °· Eat a balanced diet that is rich in nutrients to promote bone health and heart health and to manage symptoms during menopause. °This information is not intended to replace advice given to you by your health care provider. Make sure you discuss any questions you have with your health care provider. °Document Revised: 03/21/2017 Document Reviewed: 05/11/2016 °Elsevier Patient Education © 2020 [employer] °Health Maintenance, Female °Adopting a healthy lifestyle and getting preventive care are important in promoting health and wellness. Ask your health care provider about: °· The right schedule for you to have regular tests and exams. °· Things you can do on your own to prevent diseases and keep yourself healthy. °What should I know about diet, weight, and exercise? °Eat a healthy diet ° °· Eat a diet that includes plenty of vegetables, fruits, low-fat dairy products, and lean protein. °· Do not eat a lot of foods that are high in solid fats, added sugars, or sodium. °Maintain a healthy weight °Body mass index (BMI) is used to identify weight problems. It estimates body fat based   on height and weight. Your health care provider can help determine your BMI and help you achieve or maintain a healthy weight. °Get regular exercise °Get regular exercise. This is one of the most important things you can do for your health. Most adults should: °· Exercise for at least 150 minutes each week. The exercise should increase your heart rate and make you sweat (moderate-intensity exercise). °· Do strengthening exercises at least twice a week. This is in addition to the moderate-intensity exercise. °· Spend  less time sitting. Even light physical activity can be beneficial. °Watch cholesterol and blood lipids °Have your blood tested for lipids and cholesterol at 53 years of age, then have this test every 5 years. °Have your cholesterol levels checked more often if: °· Your lipid or cholesterol levels are high. °· You are older than 53 years of age. °· You are at high risk for heart disease. °What should I know about cancer screening? °Depending on your health history and family history, you may need to have cancer screening at various ages. This may include screening for: °· Breast cancer. °· Cervical cancer. °· Colorectal cancer. °· Skin cancer. °· Lung cancer. °What should I know about heart disease, diabetes, and high blood pressure? °Blood pressure and heart disease °· High blood pressure causes heart disease and increases the risk of stroke. This is more likely to develop in people who have high blood pressure readings, are of African descent, or are overweight. °· Have your blood pressure checked: °? Every 3-5 years if you are 18-39 years of age. °? Every year if you are 40 years old or older. °Diabetes °Have regular diabetes screenings. This checks your fasting blood sugar level. Have the screening done: °· Once every three years after age 40 if you are at a normal weight and have a low risk for diabetes. °· More often and at a younger age if you are overweight or have a high risk for diabetes. °What should I know about preventing infection? °Hepatitis B °If you have a higher risk for hepatitis B, you should be screened for this virus. Talk with your health care provider to find out if you are at risk for hepatitis B infection. °Hepatitis C °Testing is recommended for: °· Everyone born from 1945 through 1965. °· Anyone with known risk factors for hepatitis C. °Sexually transmitted infections (STIs) °· Get screened for STIs, including gonorrhea and chlamydia, if: °? You are sexually active and are younger than 53  years of age. °? You are older than 53 years of age and your health care provider tells you that you are at risk for this type of infection. °? Your sexual activity has changed since you were last screened, and you are at increased risk for chlamydia or gonorrhea. Ask your health care provider if you are at risk. °· Ask your health care provider about whether you are at high risk for HIV. Your health care provider may recommend a prescription medicine to help prevent HIV infection. If you choose to take medicine to prevent HIV, you should first get tested for HIV. You should then be tested every 3 months for as long as you are taking the medicine. °Pregnancy °· If you are about to stop having your period (premenopausal) and you may become pregnant, seek counseling before you get pregnant. °· Take 400 to 800 micrograms (mcg) of folic acid every day if you become pregnant. °· Ask for birth control (contraception) if you want to prevent pregnancy. °  Osteoporosis and menopause °Osteoporosis is a disease in which the bones lose minerals and strength with aging. This can result in bone fractures. If you are 65 years old or older, or if you are at risk for osteoporosis and fractures, ask your health care provider if you should: °· Be screened for bone loss. °· Take a calcium or vitamin D supplement to lower your risk of fractures. °· Be given hormone replacement therapy (HRT) to treat symptoms of menopause. °Follow these instructions at home: °Lifestyle °· Do not use any products that contain nicotine or tobacco, such as cigarettes, e-cigarettes, and chewing tobacco. If you need help quitting, ask your health care provider. °· Do not use street drugs. °· Do not share needles. °· Ask your health care provider for help if you need support or information about quitting drugs. °Alcohol use °· Do not drink alcohol if: °? Your health care provider tells you not to drink. °? You are pregnant, may be pregnant, or are planning to  become pregnant. °· If you drink alcohol: °? Limit how much you use to 0-1 drink a day. °? Limit intake if you are breastfeeding. °· Be aware of how much alcohol is in your drink. In the [place] one drink equals one 12 oz bottle of beer (355 mL), one 5 oz glass of wine (148 mL), or one 1½ oz glass of hard liquor (44 mL). °General instructions °· Schedule regular health, dental, and eye exams. °· Stay current with your vaccines. °· Tell your health care provider if: °? You often feel depressed. °? You have ever been abused or do not feel safe at home. °Summary °· Adopting a healthy lifestyle and getting preventive care are important in promoting health and wellness. °· Follow your health care provider's instructions about healthy diet, exercising, and getting tested or screened for diseases. °· Follow your health care provider's instructions on monitoring your cholesterol and blood pressure. °This information is not intended to replace advice given to you by your health care provider. Make sure you discuss any questions you have with your health care provider. °Document Revised: 04/01/2018 Document Reviewed: 04/01/2018 °Elsevier Patient Education © 2020 Elsevier Inc. ° °

## 2020-03-15 NOTE — Progress Notes (Signed)
   [facility] 1965/12/09 [id number]   History:  54 y.o. [id number] presents for annual exam. Mirena IUD inserted 10/2017 for AUB. Vasectomy. Having an increase in hot flashes/night sweats. Started Estroven 1 month ago with some improvement. Normal pap and mammogram history.   Gynecologic History No LMP recorded. (Menstrual status: IUD).   Contraception: IUD and vasectomy Last Pap: 09/09/2017. Results were: normal Last mammogram: 03/06/2020. Results were: normal Last colonoscopy: 01/2018. Results were: normal  Past medical history, past surgical history, family history and social history were all reviewed and documented in the EPIC chart.  ROS:  A ROS was performed and pertinent positives and negatives are included.  Exam:  Vitals:   03/15/20 0852  BP: 120/78  Weight: 153 lb (69.4 kg)  Height: 5\' 3"  (1.6 m)   Body mass index is 27.1 kg/m.  General appearance:  Normal Thyroid:  Symmetrical, normal in size, without palpable masses or nodularity. Respiratory  Auscultation:  Clear without wheezing or rhonchi Cardiovascular  Auscultation:  Regular rate, without rubs, murmurs or gallops  Edema/varicosities:  Not grossly evident Abdominal  Soft,nontender, without masses, guarding or rebound.  Liver/spleen:  No organomegaly noted  Hernia:  None appreciated  Skin  Inspection:  Grossly normal   Breasts: Examined lying and sitting.   Right: Without masses, retractions, discharge or axillary adenopathy.   Left: Without masses, retractions, discharge or axillary adenopathy. Gentitourinary   Inguinal/mons:  Normal without inguinal adenopathy  External genitalia:  Normal  BUS/Urethra/Skene's glands:  Normal  Vagina:  Normal  Cervix:  Normal, IUD string visible  Uterus:  Normal in size, shape and contour.  Midline and mobile  Adnexa/parametria:     Rt: Without masses or tenderness.   Lt: Without masses or tenderness.  Anus and perineum: Normal  Digital rectal  exam: Normal sphincter tone without palpated masses or tenderness  Assessment/Plan:  54 y.o. 41 for annual exam.   Well female exam with routine gynecological exam - Plan: CBC with Differential/Platelet, Comprehensive metabolic panel. Education provided on SBEs, importance of preventative screenings, current guidelines, high calcium diet, regular exercise, and multivitamin daily.   Lipid screening - Plan: Lipid panel  Hot flashes - these have become more frequent. She began taking Estroven 1 month ago with some relief. Recommended adding Vitamin E supplement daily, wearing less clothes at night and using fans.   Encounter for routine checking of intrauterine contraceptive device (IUD) - Mirena inserted July 2019. Amenorrheic. Strings visible in cervical os.   Screening for cervical cancer -normal Pap history.  Will repeat at 5-year interval per guidelines.  Screening for breast cancer -normal mammogram history.  Continue annual screenings.  Normal breast exam today.  Screening for colon cancer -up-to-date on screening colonoscopy.  Will repeat at GI's recommended interval.  Follow-up in 1 year for annual.      August 2019 [facility], 9:15 AM 03/15/2020

## 2020-03-16 LAB — CBC WITH DIFFERENTIAL/PLATELET
Absolute Monocytes: 383 cells/uL (ref 200–950)
Basophils Absolute: 32 cells/uL (ref 0–200)
Eosinophils Absolute: 81 cells/uL (ref 15–500)
HCT: 41.6 % (ref 35.0–45.0)
MCH: 29.4 pg (ref 27.0–33.0)
MCHC: 33.4 g/dL (ref 32.0–36.0)
MCV: 88.1 fL (ref 80.0–100.0)
MPV: 10.2 fL (ref 7.5–12.5)
Monocytes Relative: 8.5 %
Neutro Abs: 2682 cells/uL (ref 1500–7800)
Neutrophils Relative %: 59.6 %
RBC: 4.72 10*6/uL (ref 3.80–5.10)

## 2020-03-16 LAB — COMPREHENSIVE METABOLIC PANEL
AG Ratio: 2 (calc) (ref 1.0–2.5)
ALT: 11 U/L (ref 6–29)
AST: 12 U/L (ref 10–35)
Albumin: 4.2 g/dL (ref 3.6–5.1)
Alkaline phosphatase (APISO): 28 U/L — ABNORMAL LOW (ref 37–153)
BUN: 14 mg/dL (ref 7–25)
CO2: 25 mmol/L (ref 20–32)
Calcium: 9.6 mg/dL (ref 8.6–10.4)
Chloride: 107 mmol/L (ref 98–110)
Creat: 0.84 mg/dL (ref 0.50–1.05)
Globulin: 2.1 g/dL (calc) (ref 1.9–3.7)
Glucose, Bld: 92 mg/dL (ref 65–99)
Potassium: 4.6 mmol/L (ref 3.5–5.3)
Sodium: 139 mmol/L (ref 135–146)
Total Bilirubin: 0.4 mg/dL (ref 0.2–1.2)
Total Protein: 6.3 g/dL (ref 6.1–8.1)

## 2020-03-16 LAB — LIPID PANEL
Cholesterol: 193 mg/dL (ref <200)
HDL: 55 mg/dL (ref >=50)
LDL Cholesterol (Calc): 121 mg/dL (calc) — ABNORMAL HIGH
Non-HDL Cholesterol (Calc): 138 mg/dL (calc) — ABNORMAL HIGH (ref <130)
Total CHOL/HDL Ratio: 3.5 (calc) (ref <5.0)
Triglycerides: 71 mg/dL (ref <150)

## 2021-03-21 ENCOUNTER — Other Ambulatory Visit (HOSPITAL_BASED_OUTPATIENT_CLINIC_OR_DEPARTMENT_OTHER): Payer: Self-pay | Admitting: Nurse Practitioner

## 2021-03-21 ENCOUNTER — Telehealth: Payer: Self-pay

## 2021-03-21 ENCOUNTER — Telehealth (HOSPITAL_BASED_OUTPATIENT_CLINIC_OR_DEPARTMENT_OTHER): Payer: Self-pay

## 2021-03-21 DIAGNOSIS — Z1231 Encounter for screening mammogram for malignant neoplasm of breast: Secondary | ICD-10-CM

## 2021-03-21 NOTE — Telephone Encounter (Signed)
Patient called needing order for screening mammo faxed to [facility] in [facility].  I confirmed with her no breast problems and that it is her screening mammogram.  Order faxed.

## 2021-09-03 NOTE — Progress Notes (Signed)
? ?[facility] [birth year] [id number] ? ? ?History:  56 y.o. [id number] presents for annual exam. Mirena IUD inserted 10/2017 for AUB. Having hot flashes, night sweats, and insomnia. She previously alternated topical estrogen and testosterone prescribed by naturalist. She is also struggling with low libido. She is interested in starting bioidentical hormones.  Normal pap and mammogram history.  ? ?Gynecologic History ?No LMP recorded. (Menstrual status: IUD). ?  ?Contraception: IUD and vasectomy ?Sexually active: Yes ? ?Health maintenance ?Last Pap: 09/09/2017. Results were: Normal, 5-year repeat ?Last mammogram: 03/06/2020. Results were: Normal ?Last colonoscopy: 02/05/2018. Results were: Normal, 10-year recall ?Last Dexa: Not indicated ? ?Past medical history, past surgical history, family history and social history were all reviewed and documented in the EPIC chart. Married. 2 sons ages 29 (plays hockey in [place]) and 46 (at [employer]). From [place]. Plans to spend the summer there.  ? ?ROS:  A ROS was performed and pertinent positives and negatives are included. ? ?Exam: ? ?Vitals:  ? 09/04/21 0758  ?BP: 120/76  ?Weight: 157 lb (71.2 kg)  ?Height: 5\' 3"  (1.6 m)  ? ? ?Body mass index is 27.81 kg/m?. ? ?General appearance:  Normal ?Thyroid:  Symmetrical, normal in size, without palpable masses or nodularity. ?Respiratory ? Auscultation:  Clear without wheezing or rhonchi ?Cardiovascular ? Auscultation:  Regular rate, without rubs, murmurs or gallops ? Edema/varicosities:  Not grossly evident ?Abdominal ? Soft,nontender, without masses, guarding or rebound. ? Liver/spleen:  No organomegaly noted ? Hernia:  None appreciated ? Skin ? Inspection:  Grossly normal ?  ?Breasts: Examined lying and sitting.  ? Right: Without masses, retractions, discharge or axillary adenopathy. ? ? Left: Without masses, retractions, discharge or axillary adenopathy. ?Gentitourinary  ? Inguinal/mons:  Normal without inguinal  adenopathy ? External genitalia:  Normal ? BUS/Urethra/Skene's glands:  Normal ? Vagina:  Normal ? Cervix:  Normal, IUD string visible ? Uterus:  Normal in size, shape and contour.  Midline and mobile ? Adnexa/parametria:   ?  Rt: Without masses or tenderness. ?  Lt: Without masses or tenderness. ? Anus and perineum: Normal ? Digital rectal exam: Normal sphincter tone without palpated masses or tenderness ? ?Patient informed chaperone available to be present for breast and pelvic exam. Patient has requested no chaperone to be present. Patient has been advised what will be completed during breast and pelvic exam.  ? ?Assessment/Plan:  56 y.o. 58 for annual exam.  ? ?Well female exam with routine gynecological exam - Plan: CBC with Differential/Platelet, Comprehensive metabolic panel. Education provided on SBEs, importance of preventative screenings, current guidelines, high calcium diet, regular exercise, and multivitamin daily.  ? ?Elevated LDL cholesterol level - Plan: Lipid panel ? ?Hormone replacement therapy - Plan: estradiol (VIVELLE-DOT) 0.0375 MG/24 HR twice weekly. She is aware IUD has off-label use for endometrial protection. Will remove next year and switch to PO progesterone. She is aware of risk for blood clots, heart attack, stroke, and breast cancer. We did discuss use of testosterone as well. She will reach back out if no improvement in libido.  ? ?Encounter for routine checking of intrauterine contraceptive device (IUD) - Mirena inserted July 2019. Amenorrheic. ? ?Screening for cervical cancer - Normal Pap history.  Will repeat at 5-year interval per guidelines. ? ?Screening for breast cancer - Normal mammogram history.  Overdue and encouraged to schedule soon. Normal breast exam today. ? ?Screening for colon cancer - 2019 colonoscopy. Will repeat at 10-year interval per GI recommendation.  ? ?Follow-up in 1 year  for annual. ? ? ? ? ? ?Olivia Mackie [facility], 8:50 AM 09/04/2021 ? ?

## 2021-09-04 ENCOUNTER — Encounter: Payer: Self-pay | Admitting: Nurse Practitioner

## 2021-09-04 ENCOUNTER — Ambulatory Visit (INDEPENDENT_AMBULATORY_CARE_PROVIDER_SITE_OTHER): Payer: 59 | Admitting: Nurse Practitioner

## 2021-09-04 VITALS — BP 120/76 | Ht 63.0 in | Wt 157.0 lb

## 2021-09-04 DIAGNOSIS — E7800 Pure hypercholesterolemia, unspecified: Secondary | ICD-10-CM

## 2021-09-04 DIAGNOSIS — Z79890 Hormone replacement therapy (postmenopausal): Secondary | ICD-10-CM

## 2021-09-04 DIAGNOSIS — Z01419 Encounter for gynecological examination (general) (routine) without abnormal findings: Secondary | ICD-10-CM

## 2021-09-04 DIAGNOSIS — Z30431 Encounter for routine checking of intrauterine contraceptive device: Secondary | ICD-10-CM

## 2021-09-04 MED ORDER — ESTRADIOL 0.0375 MG/24HR TD PTTW: 1.0000 | MEDICATED_PATCH | TRANSDERMAL | 3 refills | Status: DC

## 2021-09-05 LAB — CBC WITH DIFFERENTIAL/PLATELET
Absolute Monocytes: 546 cells/uL (ref 200–950)
Basophils Absolute: 37 cells/uL (ref 0–200)
Basophils Relative: 0.6 %
Eosinophils Absolute: 161 cells/uL (ref 15–500)
Eosinophils Relative: 2.6 %
HCT: 43 % (ref 35.0–45.0)
Hemoglobin: 13.8 g/dL (ref 11.7–15.5)
Lymphs Abs: 2015 cells/uL (ref 850–3900)
MCH: 28.6 pg (ref 27.0–33.0)
MCHC: 32.1 g/dL (ref 32.0–36.0)
MCV: 89.2 fL (ref 80.0–100.0)
MPV: 9.9 fL (ref 7.5–12.5)
Monocytes Relative: 8.8 %
Neutro Abs: 3441 cells/uL (ref 1500–7800)
Neutrophils Relative %: 55.5 %
Platelets: 277 10*3/uL (ref 140–400)
RBC: 4.82 10*6/uL (ref 3.80–5.10)
RDW: 12.2 % (ref 11.0–15.0)
Total Lymphocyte: 32.5 %
WBC: 6.2 10*3/uL (ref 3.8–10.8)

## 2021-09-05 LAB — LIPID PANEL
Cholesterol: 224 mg/dL — ABNORMAL HIGH (ref <200)
HDL: 59 mg/dL (ref >=50)
LDL Cholesterol (Calc): 145 mg/dL (calc) — ABNORMAL HIGH
Non-HDL Cholesterol (Calc): 165 mg/dL (calc) — ABNORMAL HIGH (ref <130)
Total CHOL/HDL Ratio: 3.8 (calc) (ref <5.0)
Triglycerides: 95 mg/dL (ref <150)

## 2021-09-05 LAB — COMPREHENSIVE METABOLIC PANEL
AG Ratio: 1.9 (calc) (ref 1.0–2.5)
ALT: 15 U/L (ref 6–29)
AST: 13 U/L (ref 10–35)
Albumin: 4.4 g/dL (ref 3.6–5.1)
Alkaline phosphatase (APISO): 33 U/L — ABNORMAL LOW (ref 37–153)
BUN: 13 mg/dL (ref 7–25)
CO2: 26 mmol/L (ref 20–32)
Calcium: 9.9 mg/dL (ref 8.6–10.4)
Chloride: 106 mmol/L (ref 98–110)
Creat: 0.85 mg/dL (ref 0.50–1.03)
Globulin: 2.3 g/dL (calc) (ref 1.9–3.7)
Glucose, Bld: 92 mg/dL (ref 65–99)
Potassium: 4.7 mmol/L (ref 3.5–5.3)
Sodium: 140 mmol/L (ref 135–146)
Total Bilirubin: 0.4 mg/dL (ref 0.2–1.2)
Total Protein: 6.7 g/dL (ref 6.1–8.1)

## 2021-09-05 IMAGING — MG DIGITAL SCREENING BILAT W/ TOMO W/ CAD
8 series · 8 of 24 positions shown · non-contrast
Comparison: Previous exam(s).

CLINICAL DATA: Screening.

EXAM:
DIGITAL SCREENING BILATERAL MAMMOGRAM WITH TOMO AND CAD

[L CC synth-2D]
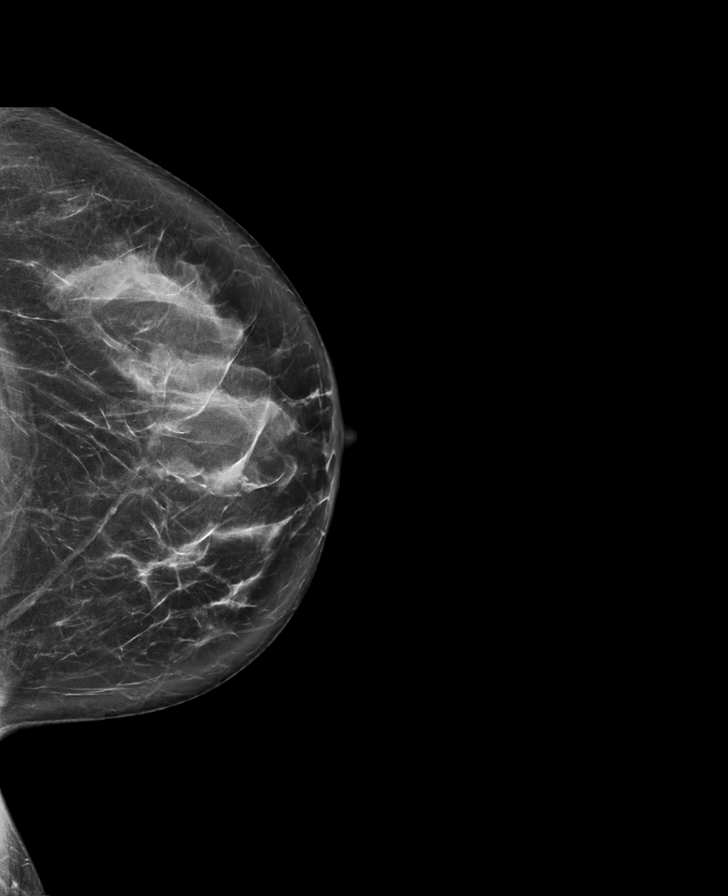

[R CC synth-2D]
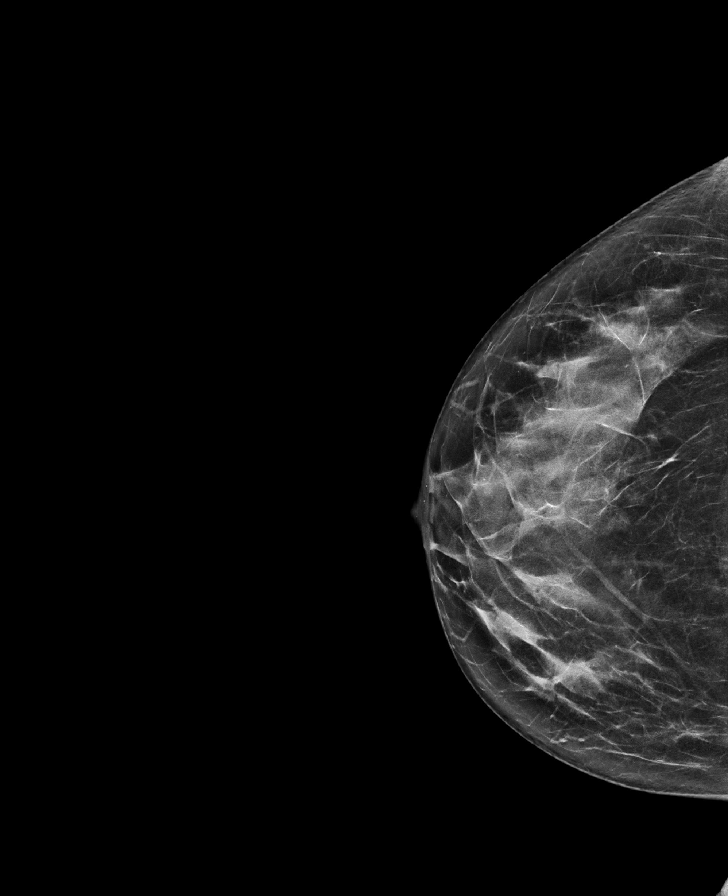

[L MLO synth-2D]
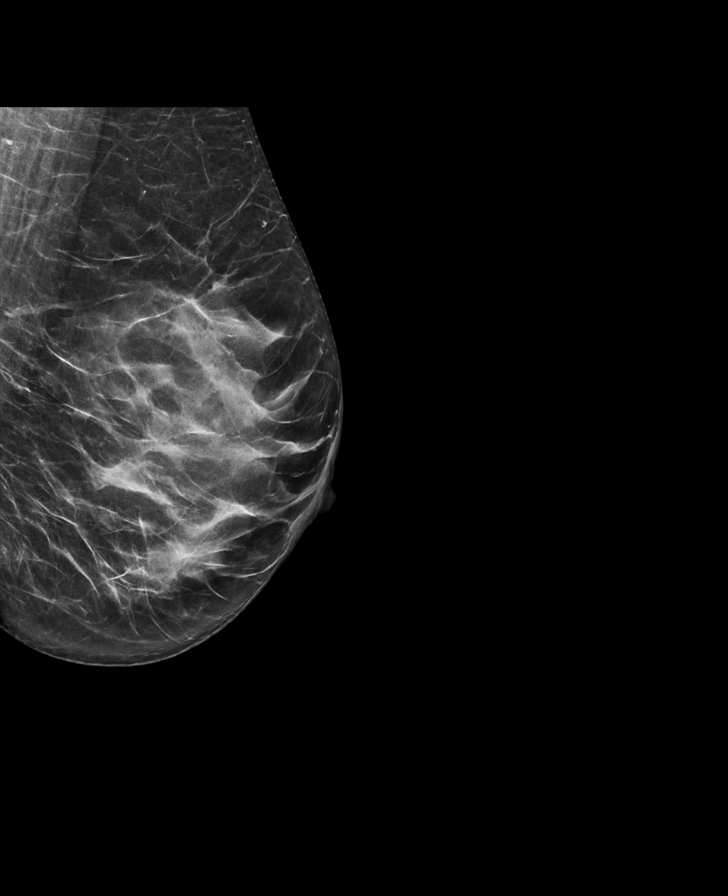

[R MLO synth-2D]
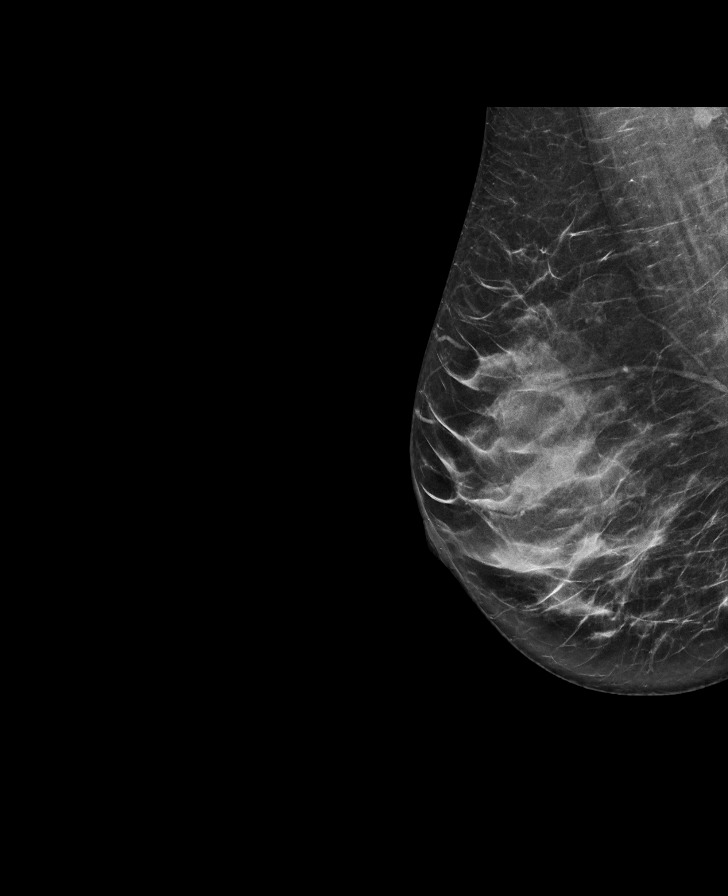

[L MLO tomo · tomo slice 35/70.0]
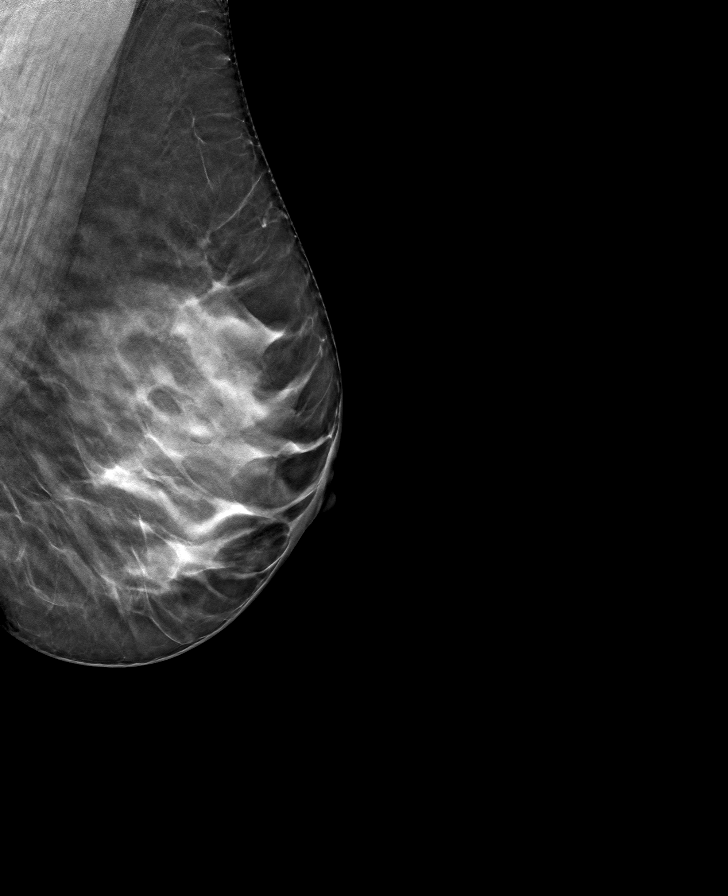

[L CC tomo · tomo slice 37/74.0]
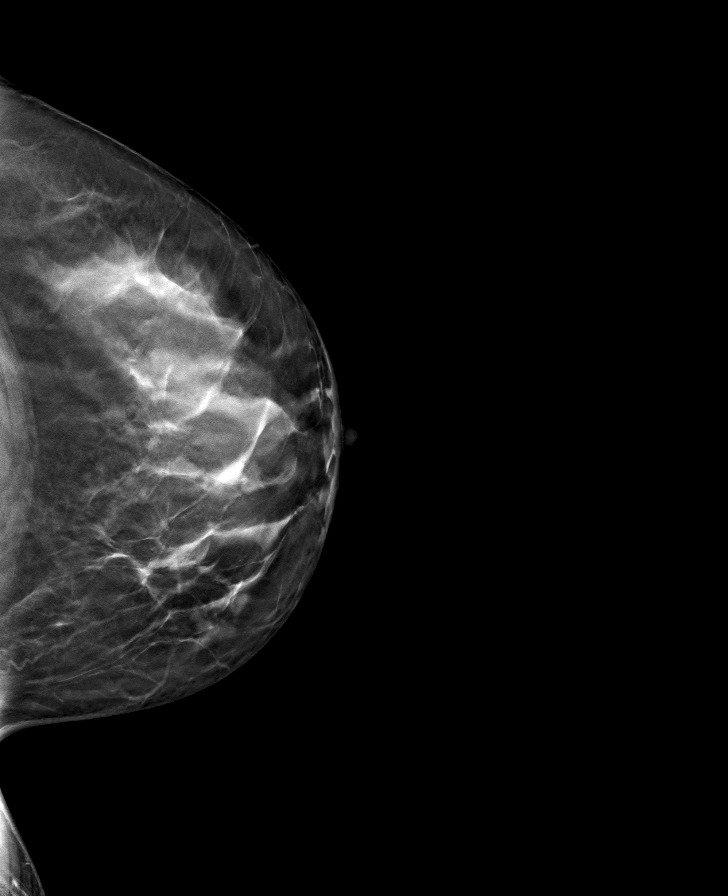

[R CC tomo · tomo slice 32/63.0]
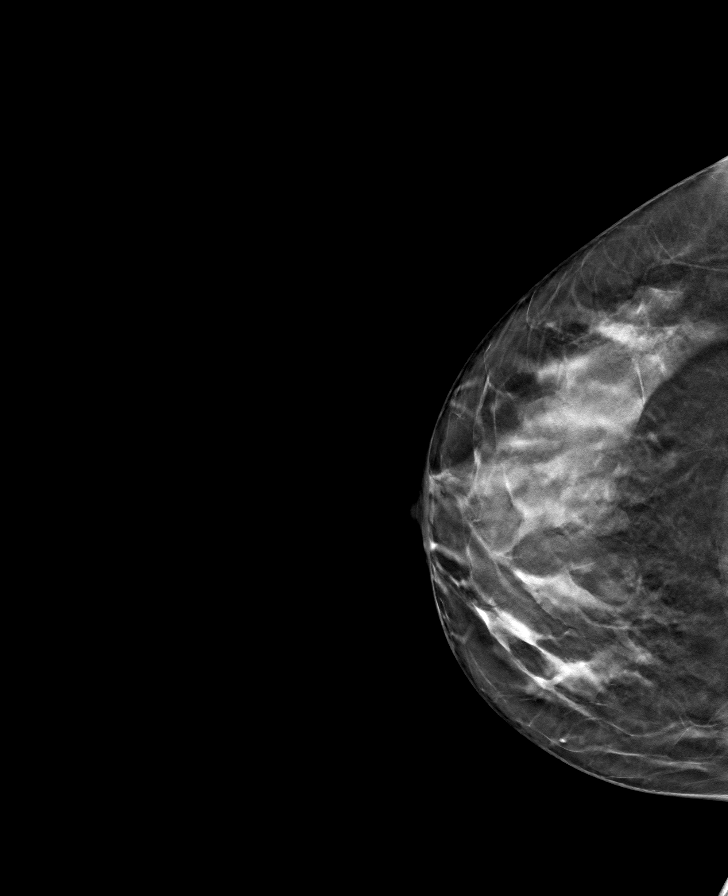

[R MLO tomo · tomo slice 35/69.0]
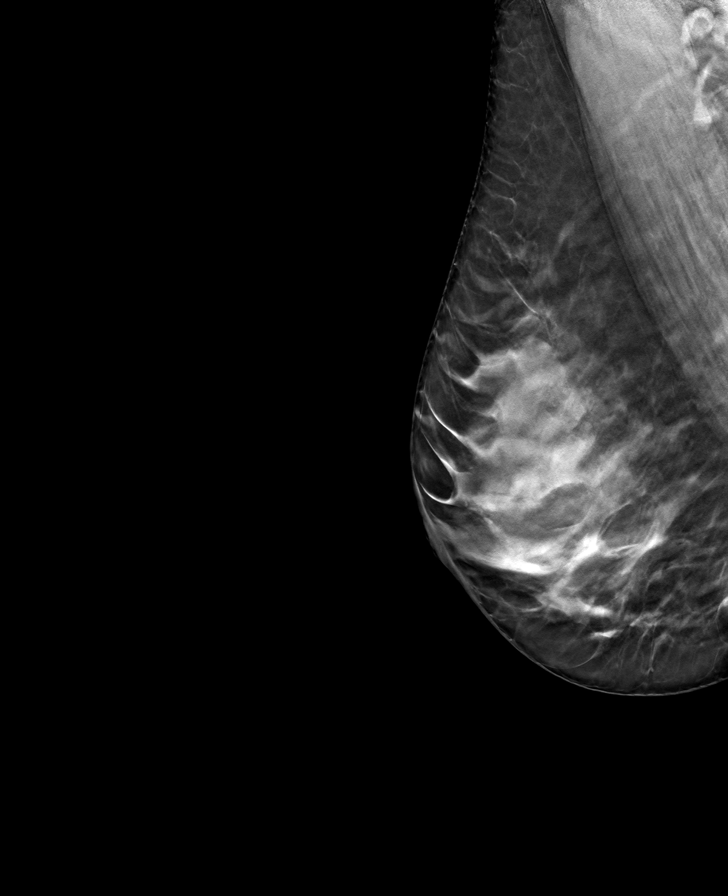

[8 of 24 positions shown; findings below may reference images not displayed]

ACR Breast Density Category d: The breast tissue is extremely dense,
which lowers the sensitivity of mammography
FINDINGS: There are no findings suspicious for malignancy. Images were
processed with CAD.
IMPRESSION: No mammographic evidence of malignancy. A result letter of this
screening mammogram will be mailed directly to the patient.

RECOMMENDATION:
Screening mammogram in one year. (Code:[id number])

BI-RADS CATEGORY  1: Negative.

## 2021-09-11 ENCOUNTER — Telehealth: Payer: Self-pay | Admitting: *Deleted

## 2021-09-11 MED ORDER — ESTRADIOL 0.0375 MG/24HR TD PTTW: 1.0000 | MEDICATED_PATCH | TRANSDERMAL | 0 refills | Status: DC

## 2021-09-11 NOTE — Telephone Encounter (Signed)
Patient called will be in [place] for the summer. She needs estradiol 0.0375 mg patch sent to pharmacy in [place]. Patient asked a 3 month supply be sent to [facility]. Rx was sent on 09/04/21 but pharmacy told patient Rx wouldn't be ready for pickup until 09/06/21. Patient was leaving for [place]  on 09/05/21. Patient had annual exam on 09/04/21.  Just fyi

## 2021-12-21 ENCOUNTER — Other Ambulatory Visit: Payer: Self-pay | Admitting: Orthopedic Surgery

## 2021-12-21 DIAGNOSIS — M25561 Pain in right knee: Secondary | ICD-10-CM

## 2021-12-26 ENCOUNTER — Ambulatory Visit
Admission: RE | Admit: 2021-12-26 | Discharge: 2021-12-26 | Disposition: A | Discharge status: Home / Self Care | Payer: 59 | Source: Ambulatory Visit | Attending: Orthopedic Surgery | Admitting: Orthopedic Surgery

## 2021-12-26 DIAGNOSIS — M25561 Pain in right knee: Secondary | ICD-10-CM

## 2022-01-10 DIAGNOSIS — J343 Hypertrophy of nasal turbinates: Secondary | ICD-10-CM | POA: Insufficient documentation

## 2022-01-10 DIAGNOSIS — J342 Deviated nasal septum: Secondary | ICD-10-CM | POA: Insufficient documentation

## 2022-03-04 ENCOUNTER — Ambulatory Visit (HOSPITAL_BASED_OUTPATIENT_CLINIC_OR_DEPARTMENT_OTHER)
Admission: RE | Admit: 2022-03-04 | Discharge: 2022-03-04 | Disposition: A | Discharge status: Home / Self Care | Payer: 59 | Source: Ambulatory Visit | Attending: Nurse Practitioner | Admitting: Nurse Practitioner

## 2022-03-04 ENCOUNTER — Encounter (HOSPITAL_BASED_OUTPATIENT_CLINIC_OR_DEPARTMENT_OTHER): Payer: Self-pay

## 2022-03-04 DIAGNOSIS — Z1231 Encounter for screening mammogram for malignant neoplasm of breast: Secondary | ICD-10-CM | POA: Insufficient documentation

## 2022-03-08 DIAGNOSIS — M65312 Trigger thumb, left thumb: Secondary | ICD-10-CM | POA: Insufficient documentation

## 2022-04-22 HISTORY — PX: OTHER SURGICAL HISTORY: SHX169

## 2022-07-12 ENCOUNTER — Other Ambulatory Visit: Payer: Self-pay | Admitting: Orthopedic Surgery

## 2022-07-12 DIAGNOSIS — M25561 Pain in right knee: Secondary | ICD-10-CM

## 2022-07-12 DIAGNOSIS — M7989 Other specified soft tissue disorders: Secondary | ICD-10-CM

## 2022-07-21 ENCOUNTER — Ambulatory Visit
Admission: RE | Admit: 2022-07-21 | Discharge: 2022-07-21 | Disposition: A | Discharge status: Home / Self Care | Payer: 59 | Source: Ambulatory Visit | Attending: Orthopedic Surgery | Admitting: Orthopedic Surgery

## 2022-07-21 DIAGNOSIS — M25561 Pain in right knee: Secondary | ICD-10-CM

## 2022-07-21 DIAGNOSIS — M7989 Other specified soft tissue disorders: Secondary | ICD-10-CM

## 2022-12-31 ENCOUNTER — Encounter: Payer: Self-pay | Admitting: Nurse Practitioner

## 2022-12-31 ENCOUNTER — Ambulatory Visit (INDEPENDENT_AMBULATORY_CARE_PROVIDER_SITE_OTHER): Payer: No Typology Code available for payment source | Admitting: Nurse Practitioner

## 2022-12-31 ENCOUNTER — Other Ambulatory Visit (HOSPITAL_COMMUNITY)
Admission: RE | Admit: 2022-12-31 | Discharge: 2022-12-31 | Disposition: A | Discharge status: Home / Self Care | Payer: No Typology Code available for payment source | Source: Ambulatory Visit | Attending: Nurse Practitioner | Admitting: Nurse Practitioner

## 2022-12-31 VITALS — BP 126/64 | HR 79 | Ht 62.5 in | Wt 157.0 lb

## 2022-12-31 DIAGNOSIS — Z124 Encounter for screening for malignant neoplasm of cervix: Secondary | ICD-10-CM

## 2022-12-31 DIAGNOSIS — E785 Hyperlipidemia, unspecified: Secondary | ICD-10-CM

## 2022-12-31 DIAGNOSIS — Z01419 Encounter for gynecological examination (general) (routine) without abnormal findings: Secondary | ICD-10-CM

## 2022-12-31 DIAGNOSIS — K640 First degree hemorrhoids: Secondary | ICD-10-CM | POA: Insufficient documentation

## 2022-12-31 DIAGNOSIS — Z30432 Encounter for removal of intrauterine contraceptive device: Secondary | ICD-10-CM

## 2022-12-31 DIAGNOSIS — Z1211 Encounter for screening for malignant neoplasm of colon: Secondary | ICD-10-CM | POA: Insufficient documentation

## 2022-12-31 NOTE — Progress Notes (Signed)
   Robin Burns [facility] 1965/12/08 [id number]   History:  57 y.o. [id number] presents for annual exam. Mirena IUD inserted 10/2017 for AUB. Amenorrheic. Would like IUD removed today. Started HRT May 2023 for hot flashes, night sweats, and insomnia. Used for about a month but felt no difference in symptoms so she discontinued. Vasomotor symptoms tolerable.  Normal pap and mammogram history.   Gynecologic History No LMP recorded. (Menstrual status: IUD).   Contraception: IUD and vasectomy Sexually active: Yes  Health maintenance Last Pap: 09/09/2017. Results were: Normal neg HPV, 5-year repeat Last mammogram: 03/04/2022. Results were: Normal Last colonoscopy: 02/05/2018. Results were: Normal, 10-year recall Last Dexa: Not indicated  Past medical history, past surgical history, family history and social history were all reviewed and documented in the EPIC chart. Married. 2 sons ages 70 (plays hockey in [place]) and 20 (at [place]). From [place]. Spends summers there.   ROS:  A ROS was performed and pertinent positives and negatives are included.  Exam:  Vitals:   12/31/22 0929  BP: 126/64  Pulse: 79  SpO2: 100%  Weight: 157 lb (71.2 kg)  Height: 5' 2.5" (1.588 m)     Body mass index is 28.26 kg/m.  General appearance:  Normal Thyroid:  Symmetrical, normal in size, without palpable masses or nodularity. Respiratory  Auscultation:  Clear without wheezing or rhonchi Cardiovascular  Auscultation:  Regular rate, without rubs, murmurs or gallops  Edema/varicosities:  Not grossly evident Abdominal  Soft,nontender, without masses, guarding or rebound.  Liver/spleen:  No organomegaly noted  Hernia:  None appreciated  Skin  Inspection:  Grossly normal   Breasts: Examined lying and sitting.   Right: Without masses, retractions, discharge or axillary adenopathy.   Left: Without masses, retractions, discharge or axillary adenopathy. Gentitourinary   Inguinal/mons:  Normal without  inguinal adenopathy  External genitalia:  Normal  BUS/Urethra/Skene's glands:  Normal  Vagina:  Normal  Cervix:  Normal, IUD string visible  Uterus:  Normal in size, shape and contour.  Midline and mobile  Adnexa/parametria:     Rt: Without masses or tenderness.   Lt: Without masses or tenderness.  Anus and perineum: Normal  Digital rectal exam: Not indicated  Patient informed chaperone available to be present for breast and pelvic exam. Patient has requested no chaperone to be present. Patient has been advised what will be completed during breast and pelvic exam.   Assessment/Plan:  57 y.o. [id number] for annual exam.   Well female exam with routine gynecological exam - Plan: CBC with Differential/Platelet, Comprehensive metabolic panel. Education provided on SBEs, importance of preventative screenings, current guidelines, high calcium diet, regular exercise, and multivitamin daily.   Screening for cervical cancer - Plan: Cytology - PAP( [facility]). Normal pap history.   Hyperlipidemia, unspecified hyperlipidemia type - Plan: Lipid panel  Encounter for IUD removal - Plan: IUD removal. IUD removed with ease, patient tolerated well.   Screening for breast cancer - Normal mammogram history.  Continue annual screenings. Normal breast exam today.  Screening for colon cancer - 2019 colonoscopy. Will repeat at 10-year interval per GI recommendation.   Follow-up in 1 year for annual.      Olivia Mackie [facility], 9:56 AM 12/31/2022

## 2023-01-01 LAB — CBC WITH DIFFERENTIAL/PLATELET
Absolute Monocytes: 410 {cells}/uL (ref 200–950)
Basophils Absolute: 50 {cells}/uL (ref 0–200)
Basophils Relative: 1.1 %
Eosinophils Absolute: 99 {cells}/uL (ref 15–500)
Eosinophils Relative: 2.2 %
HCT: 40.6 % (ref 35.0–45.0)
Hemoglobin: 13.4 g/dL (ref 11.7–15.5)
Lymphs Abs: 1323 {cells}/uL (ref 850–3900)
MCH: 28.9 pg (ref 27.0–33.0)
MCHC: 33 g/dL (ref 32.0–36.0)
MCV: 87.7 fL (ref 80.0–100.0)
MPV: 10.5 fL (ref 7.5–12.5)
Monocytes Relative: 9.1 %
Neutro Abs: 2619 {cells}/uL (ref 1500–7800)
Neutrophils Relative %: 58.2 %
Platelets: 258 10*3/uL (ref 140–400)
RBC: 4.63 10*6/uL (ref 3.80–5.10)
RDW: 12.2 % (ref 11.0–15.0)
Total Lymphocyte: 29.4 %
WBC: 4.5 10*3/uL (ref 3.8–10.8)

## 2023-01-01 LAB — COMPREHENSIVE METABOLIC PANEL
AG Ratio: 2.3 (calc) (ref 1.0–2.5)
ALT: 11 U/L (ref 6–29)
AST: 13 U/L (ref 10–35)
Albumin: 4.5 g/dL (ref 3.6–5.1)
Alkaline phosphatase (APISO): 39 U/L (ref 37–153)
BUN: 12 mg/dL (ref 7–25)
CO2: 23 mmol/L (ref 20–32)
Calcium: 9.8 mg/dL (ref 8.6–10.4)
Chloride: 107 mmol/L (ref 98–110)
Creat: 0.76 mg/dL (ref 0.50–1.03)
Globulin: 2 g/dL (ref 1.9–3.7)
Glucose, Bld: 103 mg/dL — ABNORMAL HIGH (ref 65–99)
Potassium: 4.3 mmol/L (ref 3.5–5.3)
Sodium: 139 mmol/L (ref 135–146)
Total Bilirubin: 0.5 mg/dL (ref 0.2–1.2)
Total Protein: 6.5 g/dL (ref 6.1–8.1)

## 2023-01-01 LAB — LIPID PANEL
Cholesterol: 218 mg/dL — ABNORMAL HIGH (ref <200)
HDL: 60 mg/dL (ref >=50)
LDL Cholesterol (Calc): 141 mg/dL — ABNORMAL HIGH
Non-HDL Cholesterol (Calc): 158 mg/dL — ABNORMAL HIGH (ref <130)
Total CHOL/HDL Ratio: 3.6 (calc) (ref <5.0)
Triglycerides: 80 mg/dL (ref <150)

## 2023-01-02 LAB — CYTOLOGY - PAP
Adequacy: ABSENT
Comment: NEGATIVE
Diagnosis: NEGATIVE
High risk HPV: NEGATIVE

## 2023-06-02 ENCOUNTER — Other Ambulatory Visit (HOSPITAL_BASED_OUTPATIENT_CLINIC_OR_DEPARTMENT_OTHER): Payer: Self-pay | Admitting: Nurse Practitioner

## 2023-06-02 DIAGNOSIS — Z1231 Encounter for screening mammogram for malignant neoplasm of breast: Secondary | ICD-10-CM

## 2023-06-03 ENCOUNTER — Ambulatory Visit (HOSPITAL_BASED_OUTPATIENT_CLINIC_OR_DEPARTMENT_OTHER)
Admission: RE | Admit: 2023-06-03 | Discharge: 2023-06-03 | Disposition: A | Discharge status: Home / Self Care | Payer: 59 | Source: Ambulatory Visit | Attending: Nurse Practitioner | Admitting: Nurse Practitioner

## 2023-06-03 ENCOUNTER — Encounter (HOSPITAL_BASED_OUTPATIENT_CLINIC_OR_DEPARTMENT_OTHER): Payer: Self-pay

## 2023-06-03 DIAGNOSIS — Z1231 Encounter for screening mammogram for malignant neoplasm of breast: Secondary | ICD-10-CM | POA: Diagnosis present

## 2023-07-08 ENCOUNTER — Telehealth: Payer: Self-pay | Admitting: *Deleted

## 2023-07-08 NOTE — Telephone Encounter (Signed)
 Spoke with patient. Patient reports feeling bloated with pressure in lower abd/pelvis. Reports urinary frequency, voiding small amounts.  Denies any other GYN symptoms or fever/chills, N/V, bleeding.   Mirena IUD removed last year. Had hormone pellets placed 2.5 weeks ago a [facility].   Patient is requesting PUS to have ovaries checked.   Last AEX 12/31/22.  OV scheduled for 07/09/23 at 1100 with Tiffany.   Advised will forward to Tiffany for final review. Our office will f/u with any additional recommendations.   Routing to provider for final review. Patient is agreeable to disposition. Will close encounter.

## 2023-07-09 ENCOUNTER — Encounter: Payer: Self-pay | Admitting: Nurse Practitioner

## 2023-07-09 ENCOUNTER — Ambulatory Visit (INDEPENDENT_AMBULATORY_CARE_PROVIDER_SITE_OTHER): Admitting: Nurse Practitioner

## 2023-07-09 VITALS — BP 132/84 | HR 62 | Resp 14 | Wt 160.0 lb

## 2023-07-09 DIAGNOSIS — R102 Pelvic and perineal pain: Secondary | ICD-10-CM

## 2023-07-09 DIAGNOSIS — R140 Abdominal distension (gaseous): Secondary | ICD-10-CM | POA: Diagnosis not present

## 2023-07-09 NOTE — Progress Notes (Signed)
   Acute Office Visit  Subjective:    Patient ID: Robin Burns, female    DOB: [birth year], 58 y.o.   MRN: [id number]   HPI 58 y.o. presents today for pelvic pressure and bloating. Started testosterone + estrogen pellets 3 weeks ago. No bleeding. Worried about ovarian cancer. Denies vaginal or urinary symptoms.   No LMP recorded. (Menstrual status: Other).    Review of Systems  Constitutional: Negative.   Gastrointestinal:  Negative for constipation and diarrhea.       Abdominal bloating  Genitourinary:  Positive for pelvic pain (Pressure). Negative for dysuria, flank pain, frequency, hematuria, urgency, vaginal bleeding, vaginal discharge and vaginal pain.       Objective:    Physical Exam Constitutional:      Appearance: Normal appearance.   GU: Not indicated  BP 132/84   Pulse 62   Resp 14   Wt 160 lb (72.6 kg)   BMI 28.80 kg/m  Wt Readings from Last 3 Encounters:  07/09/23 160 lb (72.6 kg)  12/31/22 157 lb (71.2 kg)  09/04/21 157 lb (71.2 kg)       Assessment & Plan:   Problem List Items Addressed This Visit   None Visit Diagnoses       Abdominal bloating    -  Primary   Relevant Orders   US PELVIS TRANSVAGINAL NON-OB (TV ONLY)     Pelvic pressure in female       Relevant Orders   US PELVIS TRANSVAGINAL NON-OB (TV ONLY)      Plan: Will schedule pelvic ultrasound. Reassurance that symptoms likely from starting HRT.   Return if symptoms worsen or fail to improve.    Olivia Mackie DNP, 11:21 AM 07/09/2023

## 2023-07-30 ENCOUNTER — Encounter: Payer: Self-pay | Admitting: Nurse Practitioner

## 2023-08-05 ENCOUNTER — Ambulatory Visit (INDEPENDENT_AMBULATORY_CARE_PROVIDER_SITE_OTHER)

## 2023-08-05 ENCOUNTER — Ambulatory Visit (INDEPENDENT_AMBULATORY_CARE_PROVIDER_SITE_OTHER): Admitting: Nurse Practitioner

## 2023-08-05 ENCOUNTER — Encounter: Payer: Self-pay | Admitting: Nurse Practitioner

## 2023-08-05 VITALS — BP 116/72 | HR 62

## 2023-08-05 DIAGNOSIS — R102 Pelvic and perineal pain: Secondary | ICD-10-CM | POA: Diagnosis not present

## 2023-08-05 DIAGNOSIS — R140 Abdominal distension (gaseous): Secondary | ICD-10-CM | POA: Diagnosis not present

## 2023-08-05 NOTE — Progress Notes (Signed)
   Acute Office Visit  Subjective:    Patient ID: Robin Burns, female    DOB: 1965-06-01, 58 y.o.   MRN: [id number]   HPI 58 y.o. presents today for ultrasound. Seen 07/09/2023 with complaints of pelvic pressure and bloating. Started testosterone + estrogen pellets 3 weeks prior to that visit. No bleeding. Worried about ovarian cancer. Feels symptoms are a little better.   No LMP recorded. (Menstrual status: Other).    Review of Systems  Constitutional: Negative.   Gastrointestinal:  Negative for constipation, diarrhea and nausea.       Abdominal bloating  Genitourinary:  Positive for pelvic pain (Pressure).       Objective:    Physical Exam Constitutional:      Appearance: Normal appearance.   GU: Not indicated  BP 116/72   Pulse 62   SpO2 97%  Wt Readings from Last 3 Encounters:  07/09/23 160 lb (72.6 kg)  12/31/22 157 lb (71.2 kg)  09/04/21 157 lb (71.2 kg)        Assessment & Plan:   Problem List Items Addressed This Visit   None Visit Diagnoses       Abdominal bloating    -  Primary     Pelvic pressure in female          Vaginal ultrasound: Anteverted uterus, normal size and shape, 2 subcentimeter intramural fibroids noted. Thin, symmetrical endometrium. No masses or thickening seen. Both ovaries atrophic in size with no masses seen.  No adnexal masses, no free fluid.  Plan: Reviewed normal ultrasound findings. Denies GI symptoms. Could be related to starting hormones. Symptoms are improving.   Return if symptoms worsen or fail to improve.    Robin Bamberger DNP, 2:17 PM 08/05/2023
